# Patient Record
Sex: Female | Born: 1975 | Race: White | Hispanic: No | Smoking: Current every day smoker
Health system: Southern US, Community
[De-identification: ages and names within clinical notes are randomized; demographics above are authoritative.]

## PROBLEM LIST (undated history)

## (undated) DIAGNOSIS — J309 Allergic rhinitis, unspecified: Secondary | ICD-10-CM

## (undated) DIAGNOSIS — Z9889 Other specified postprocedural states: Secondary | ICD-10-CM

## (undated) DIAGNOSIS — Z720 Tobacco use: Secondary | ICD-10-CM

## (undated) DIAGNOSIS — F419 Anxiety disorder, unspecified: Secondary | ICD-10-CM

## (undated) DIAGNOSIS — F329 Major depressive disorder, single episode, unspecified: Secondary | ICD-10-CM

## (undated) HISTORY — PX: TONSILLECTOMY AND ADENOIDECTOMY: SUR1326

## (undated) HISTORY — DX: Tobacco use: Z72.0

## (undated) HISTORY — DX: Other specified postprocedural states: Z98.890

## (undated) HISTORY — DX: Anxiety disorder, unspecified: F41.9

## (undated) HISTORY — DX: Allergic rhinitis, unspecified: J30.9

## (undated) HISTORY — DX: Major depressive disorder, single episode, unspecified: F32.9

---

## 1998-10-30 ENCOUNTER — Emergency Department (HOSPITAL_COMMUNITY): Admission: EM | Admit: 1998-10-30 | Discharge: 1998-10-30 | Payer: Self-pay | Admitting: Emergency Medicine

## 2001-08-15 ENCOUNTER — Emergency Department (HOSPITAL_COMMUNITY): Admission: EM | Admit: 2001-08-15 | Discharge: 2001-08-15 | Payer: Self-pay

## 2001-11-08 ENCOUNTER — Ambulatory Visit (HOSPITAL_COMMUNITY): Admission: RE | Admit: 2001-11-08 | Discharge: 2001-11-08 | Payer: Self-pay

## 2002-02-08 ENCOUNTER — Ambulatory Visit (HOSPITAL_COMMUNITY): Admission: RE | Admit: 2002-02-08 | Discharge: 2002-02-08 | Payer: Self-pay

## 2002-11-22 ENCOUNTER — Encounter (INDEPENDENT_AMBULATORY_CARE_PROVIDER_SITE_OTHER): Payer: Self-pay | Admitting: Specialist

## 2002-11-22 ENCOUNTER — Ambulatory Visit (HOSPITAL_COMMUNITY): Admission: RE | Admit: 2002-11-22 | Discharge: 2002-11-22 | Payer: Self-pay | Admitting: Obstetrics and Gynecology

## 2003-02-24 ENCOUNTER — Other Ambulatory Visit: Admission: RE | Admit: 2003-02-24 | Discharge: 2003-02-24 | Payer: Self-pay | Admitting: Obstetrics and Gynecology

## 2003-08-04 ENCOUNTER — Ambulatory Visit (HOSPITAL_COMMUNITY): Admission: RE | Admit: 2003-08-04 | Discharge: 2003-08-04 | Payer: Self-pay | Admitting: Obstetrics and Gynecology

## 2003-12-05 ENCOUNTER — Inpatient Hospital Stay (HOSPITAL_COMMUNITY): Admission: AD | Admit: 2003-12-05 | Discharge: 2003-12-05 | Payer: Self-pay | Admitting: Obstetrics and Gynecology

## 2004-01-30 ENCOUNTER — Ambulatory Visit: Payer: Self-pay | Admitting: Internal Medicine

## 2004-02-14 ENCOUNTER — Inpatient Hospital Stay (HOSPITAL_COMMUNITY): Admission: AD | Admit: 2004-02-14 | Discharge: 2004-02-14 | Payer: Self-pay | Admitting: Obstetrics and Gynecology

## 2004-03-03 ENCOUNTER — Inpatient Hospital Stay (HOSPITAL_COMMUNITY): Admission: AD | Admit: 2004-03-03 | Discharge: 2004-03-05 | Payer: Self-pay | Admitting: Obstetrics and Gynecology

## 2004-06-19 ENCOUNTER — Ambulatory Visit: Payer: Self-pay | Admitting: Family Medicine

## 2004-06-21 ENCOUNTER — Other Ambulatory Visit: Admission: RE | Admit: 2004-06-21 | Discharge: 2004-06-21 | Payer: Self-pay | Admitting: Obstetrics and Gynecology

## 2004-11-08 ENCOUNTER — Ambulatory Visit: Payer: Self-pay | Admitting: Internal Medicine

## 2004-12-02 ENCOUNTER — Ambulatory Visit: Payer: Self-pay | Admitting: Internal Medicine

## 2006-02-05 ENCOUNTER — Ambulatory Visit: Payer: Self-pay | Admitting: Internal Medicine

## 2006-11-05 ENCOUNTER — Ambulatory Visit: Payer: Self-pay | Admitting: Internal Medicine

## 2006-12-30 ENCOUNTER — Inpatient Hospital Stay (HOSPITAL_COMMUNITY): Admission: AD | Admit: 2006-12-30 | Discharge: 2006-12-30 | Payer: Self-pay | Admitting: Obstetrics

## 2007-03-16 ENCOUNTER — Inpatient Hospital Stay (HOSPITAL_COMMUNITY): Admission: AD | Admit: 2007-03-16 | Discharge: 2007-03-16 | Payer: Self-pay | Admitting: Obstetrics and Gynecology

## 2007-04-13 ENCOUNTER — Inpatient Hospital Stay (HOSPITAL_COMMUNITY): Admission: AD | Admit: 2007-04-13 | Discharge: 2007-04-13 | Payer: Self-pay | Admitting: Obstetrics and Gynecology

## 2007-04-16 ENCOUNTER — Inpatient Hospital Stay (HOSPITAL_COMMUNITY): Admission: AD | Admit: 2007-04-16 | Discharge: 2007-04-17 | Payer: Self-pay | Admitting: Obstetrics

## 2007-06-30 ENCOUNTER — Ambulatory Visit: Admission: RE | Admit: 2007-06-30 | Discharge: 2007-06-30 | Payer: Self-pay | Admitting: Certified Nurse Midwife

## 2009-09-13 ENCOUNTER — Ambulatory Visit: Payer: Self-pay | Admitting: Internal Medicine

## 2009-09-13 DIAGNOSIS — J309 Allergic rhinitis, unspecified: Secondary | ICD-10-CM

## 2009-09-13 DIAGNOSIS — F32A Depression, unspecified: Secondary | ICD-10-CM

## 2009-09-13 HISTORY — DX: Depression, unspecified: F32.A

## 2009-09-13 HISTORY — DX: Allergic rhinitis, unspecified: J30.9

## 2009-12-19 ENCOUNTER — Inpatient Hospital Stay (HOSPITAL_COMMUNITY)
Admission: AD | Admit: 2009-12-19 | Discharge: 2009-12-19 | Payer: Self-pay | Source: Home / Self Care | Attending: Obstetrics | Admitting: Obstetrics

## 2010-02-07 NOTE — Assessment & Plan Note (Signed)
Summary: LG FEVER, COUGH, CONGESTION // RS   Vital Signs:  [name] profile:   35 year old female Weight:      118 pounds Temp:     98.1 degrees F 110oral BP sitting:   110 / 70  (right arm) Cuff size:   regular  Vitals Entered By: Duard Brady LPN (September 13, 2009 9:05 AM) CC: c/o sinus congestion , non productive cough Is [name] Diabetic? No   CC:  c/o sinus congestion  and non productive cough.  History of Present Illness: Gabriella [name] with a 7-day history of mild sore throat, nonproductive cough, malaise.  There is been no fever.  She is on day 3 of 10 of Biaxin from an urgent  care center.  Denies any chest pain or shortness of breath.  Her daughter has had a similar illness.  She continues to smoke  Preventive Screening-Counseling & Management  Alcohol-Tobacco     Smoking Status: current     Smoking Cessation Counseling: yes  Allergies (verified): No Known Drug Allergies  Past History:  Past Medical History: Panic disorder Allergic rhinitis Anxiety Depression  Past Surgical History: T/A 77 G2P2A0 ( 06 and 09) R knee arthoscopic 07  Family History: Reviewed history from 08/26/2006 and no changes required. Family History Breast cancer 1st degree relative <50 Family History Diabetes 1st degree relative Family History Kidney disease  Social History: Reviewed history from 08/26/2006 and no changes required. Occupation: Massage therapist  Married Current Smoker Alcohol use-yes  Review of Systems       The [name] complains of prolonged cough.  The [name] denies anorexia, fever, weight loss, weight gain, vision loss, decreased hearing, hoarseness, chest pain, syncope, dyspnea on exertion, peripheral edema, headaches, hemoptysis, abdominal pain, melena, hematochezia, severe indigestion/heartburn, hematuria, incontinence, genital sores, muscle weakness, suspicious skin lesions, transient blindness, difficulty walking, depression, unusual weight  change, abnormal bleeding, enlarged lymph nodes, angioedema, and breast masses.    Physical Exam  General:  Well-developed,well-nourished,in no acute distress; alert,appropriate and cooperative throughout examination Head:  Normocephalic and atraumatic without obvious abnormalities. No apparent alopecia or balding. Eyes:  No corneal or conjunctival inflammation noted. EOMI. Perrla. Funduscopic exam benign, without hemorrhages, exudates or papilledema. Vision grossly normal. Ears:  External ear exam shows no significant lesions or deformities.  Otoscopic examination reveals clear canals, tympanic membranes are intact bilaterally without bulging, retraction, inflammation or discharge. Hearing is grossly normal bilaterally. Mouth:  Oral mucosa and oropharynx without lesions or exudates.  Teeth in good repair. Neck:  No deformities, masses, or tenderness noted. Chest Wall:  No deformities, masses, or tenderness noted. Lungs:  Normal respiratory effort, chest expands symmetrically. Lungs are clear to auscultation, no crackles or wheezes.   Impression & Recommendations:  Problem # 1:  UPPER RESPIRATORY INFECTION, VIRAL (ICD-465.9)and anger  Her updated medication list for this problem includes:    Tessalon Perles 100 Mg Caps (Benzonatate) .Marland Kitchen... Three times a day prn    Hydrocodone-homatropine 5-1.5 Mg/64ml Syrp (Hydrocodone-homatropine) .Marland Kitchen... 1 teaspoon every 6 hours as needed for cough  Complete Medication List: 1)  **no Regular Rx Meds  2)  Tessalon Perles 100 Mg Caps (Benzonatate) .... Three times a day prn 3)  Biaxin Xl 500 Mg Xr24h-tab (Clarithromycin) .... 2 once daily for 10days 4)  Hydrocodone-homatropine 5-1.5 Mg/12ml Syrp (Hydrocodone-homatropine) .Marland Kitchen.. 1 teaspoon every 6 hours as needed for cough  [name] Instructions: 1)  Get plenty of rest, drink lots of clear liquids, and use Tylenol or Ibuprofen for  fever and comfort. Return in 7-10 days if you're not better:sooner if you're  feeling worse. 2)  Tobacco is very bad for your health and your loved ones! You Should stop smoking!. Prescriptions: HYDROCODONE-HOMATROPINE 5-1.5 MG/5ML SYRP (HYDROCODONE-HOMATROPINE) 1 teaspoon every 6 hours as needed for cough  #6 oz x 1   Entered and Authorized by:   Gordy Savers  MD   Signed by:   Gordy Savers  MD on 09/13/2009   Method used:   Print then Give to [name]   RxID:   [id number]

## 2010-03-18 LAB — HCG, QUANTITATIVE, PREGNANCY: hCG, Beta Chain, Quant, S: 110788 m[IU]/mL — ABNORMAL HIGH (ref <5)

## 2010-05-24 NOTE — H&P (Signed)
NAME:  Gabriella [name], Gabriella [name]                            ACCOUNT NO.:  [id number]   MEDICAL RECORD NO.:  [id number]                   PATIENT TYPE:  AMB   LOCATION:  SDC                                  FACILITY:  [facility]   PHYSICIAN:  Charles A. Sydnee Cabal, MD            DATE OF BIRTH:  [birth year]   DATE OF ADMISSION:  11/22/2002  DATE OF DISCHARGE:                                HISTORY & PHYSICAL   HISTORY OF PRESENT ILLNESS:  This is a 35 year old, P0-0-0-0, [facility] May 28, 2003, now 30 weeks' estimated gestation age who was noted to have an 8-3/7-  week fetal demise now missed abortion.  Ultrasound was done on November 15, 2002, showing these findings with no fetal heart rate or activity consistent  with a fetal demise.  Gestational sac measured 10-3/7 weeks with crown rump  length 8-3/7 weeks.  She has had some spotting since the day of ultrasound  and now presents with decision to have D&C for termination/conclusion of  this missed abortion.   PAST MEDICAL HISTORY:  Anxiety.   PAST SURGICAL HISTORY:  Tonsillectomy and adenoidectomy.   MEDICATIONS:  Xanax, dose not specified since learning of the fetal demise.   ALLERGIES:  No known drug allergies.   SOCIAL HISTORY:  One pack per day cigarette smoking.  No alcohol or drug  use.  The patient is married.   FAMILY HISTORY:  Unrelated.   REVIEW OF SYMPTOMS:  The patient denies any significant pelvic cramping or  pain.  She does have a slight amount of dark blood, no passage of tissue or  clots.   PHYSICAL EXAMINATION:  GENERAL:  Alert and oriented x3.  VITAL SIGNS:  Blood pressure 110/70, heart rate 64, weight 115 pounds,  respirations 16.  HEENT:  Within normal limits.  NECK:  Supple without thyromegaly or adenopathy.  LUNGS:  Clear bilaterally.  HEART:  Regular rate and rhythm without murmurs, rubs or gallops.  ABDOMEN:  Soft, flat, nontender, no hepatosplenomegaly or masses noted.  BREASTS:  Deferred.  PELVIC:  Normal external  female genitalia.  Vault with a small amount of  dark blood.  Cervix is closed.  No cervical motion tenderness is present.  Uterus is mid plane, eight- to nine-week size.  Adnexa nontender.  No masses  bilaterally.  Ovaries normal size bilaterally.  EXTREMITIES:  No edema.   ASSESSMENT:  An 8-3/7 week missed abortion.   PLAN:  1. Suction dilatation and curettage.  2. NPO passed midnight.  3. CBC.  4. The patient gives informed consent and accepts risks of infection,     bleeding, bowel and bladder damage, uterine perforation risk, retained     products risk, blood products risk including hepatitis and HIV exposure.     She gives her informed consent.  Will have consent signed the morning of     procedure.  All questions were answered.  Alternative forms of treatment     including expectant management and spontaneous miscarriage are discussed.     The patient elects for surgical intervention at this time.  All questions     were answered.  She will remain NPO passed midnight and we will proceed     with surgery in the morning as dictated.                                               Charles A. Sydnee Cabal, MD    CAD/MEDQ  D:  11/21/2002  T:  11/21/2002  Job:  [id number]

## 2010-05-24 NOTE — Op Note (Signed)
   NAME:  Gabriella [name], Gabriella [name]                            ACCOUNT NO.:  [id number]   MEDICAL RECORD NO.:  [id number]                   PATIENT TYPE:  AMB   LOCATION:  SDC                                  [facility]ILITY:  [facility]   PHYSICIAN:  Charles A. Sydnee Cabal, MD            DATE OF BIRTH:  01-Jan-1976   DATE OF PROCEDURE:  11/22/2002  DATE OF DISCHARGE:                                 OPERATIVE REPORT   PREOPERATIVE DIAGNOSIS:  Missed abortion, 8-1/2 weeks.   POSTOPERATIVE DIAGNOSIS:  Missed abortion, 8-1/2 weeks.   PROCEDURES:  1. Suction dilation and evacuation.  2. Paracervical block.   SURGEON:  Charles A. Delcambre, MD   COMPLICATIONS:  None.   ESTIMATED BLOOD LOSS:  50 mL.   ANESTHESIA:  Laryngeal general and paracervical block.   SPECIMENS:  Products of conception to pathology.   OPERATIVE FINDINGS:  Examination under anesthesia:  An 8-9 week uterus,  moderate amount of products of conception noted with procedure.   DESCRIPTION OF PROCEDURE:  The patient was taken to the operating room and  placed in the supine position.  General anesthetic was induced without  difficulty.  She was placed in the universal stirrups, Allen type, and  sterilely prepped and draped.  A single-tooth tenaculum was used on the  cervix.  A paracervical block was placed at 5 and 8 o'clock, a total of  approximately 8 mL divided equally of 0.25% plain Marcaine.  Hegar dilators  were then used to dilate up to allow passage of an 8 mm suction curette.  Suction was placed at 50 mmHg and circumferential curetting was undertaken.  There was no evidence of perforation.  A moderate amount of tissue was  aspirated on the first pass.  The second pass yielded minimal tissue.  The  third pass yielded no further tissue.  A banjo curette was then used to  explore the uterine cavity.  The cavity was found to be with no additional  tissue.  There was no evidence of perforation once again with curetting.  The suction  curette was passed one final time and no further tissue or  significant bleeding was encountered.  The procedure was then terminated.  The tenaculum was removed.  All instruments were removed.  The patient was  awakened and taken to recovery, having tolerated the procedure well.                                               Charles A. Sydnee Cabal, MD    CAD/MEDQ  D:  11/22/2002  T:  11/22/2002  Job:  [id number]

## 2010-05-29 ENCOUNTER — Other Ambulatory Visit (HOSPITAL_COMMUNITY): Payer: Self-pay | Admitting: Certified Nurse Midwife

## 2010-05-29 ENCOUNTER — Encounter: Payer: Self-pay | Admitting: Family Medicine

## 2010-05-29 ENCOUNTER — Ambulatory Visit (INDEPENDENT_AMBULATORY_CARE_PROVIDER_SITE_OTHER): Payer: BC Managed Care – PPO | Admitting: Family Medicine

## 2010-05-29 VITALS — BP 130/78 | Temp 98.3°F | Ht 63.0 in | Wt 142.0 lb

## 2010-05-29 DIAGNOSIS — J329 Chronic sinusitis, unspecified: Secondary | ICD-10-CM

## 2010-05-29 MED ORDER — AMOXICILLIN 875 MG PO TABS: 875.0000 mg | ORAL_TABLET | Freq: Two times a day (BID) | ORAL | Status: AC

## 2010-05-29 NOTE — Patient Instructions (Signed)

## 2010-05-29 NOTE — Progress Notes (Signed)
  Subjective:    Patient ID: GIULLIANA MCROBERTS, female    DOB: 1975/08/10, 35 y.o.   MRN: [id number]  HPI Patient is [redacted] weeks pregnant. Same with one-week history of progressive sinus congestive symptoms. Now some cough. Right maxillary facial pain in bilateral frontal sinus pressure. Intermittent headaches. Increase malaise. No fever. Yellowish nasal discharge. Cough mostly nonproductive. History of frequent sinusitis in the past. Possible allergy triggered. Denies nausea, vomiting, or diarrhea. No history of asthma.   Review of Systems  Constitutional: Negative for fever and chills.  HENT: Positive for congestion, postnasal drip and sinus pressure. Negative for voice change.   Respiratory: Positive for cough. Negative for shortness of breath and wheezing.   Cardiovascular: Negative for chest pain, palpitations and leg swelling.  Neurological: Positive for headaches.       Objective:   Physical Exam  Constitutional: She appears well-developed and well-nourished.  HENT:  Head: Normocephalic and atraumatic.  Right Ear: External ear normal.  Left Ear: External ear normal.  Mouth/Throat: Oropharynx is clear and moist.       Swollen mucosa right naris otherwise normal  Eyes: Conjunctivae are normal.  Neck: Neck supple.  Cardiovascular: Normal rate, regular rhythm and normal heart sounds.   Pulmonary/Chest: Effort normal and breath sounds normal. No respiratory distress. She has no wheezes. She has no rales.  Lymphadenopathy:    She has no cervical adenopathy.          Assessment & Plan:  Acute sinusitis. Amoxicillin 875 mg twice daily for 10 days. Followup as needed

## 2010-06-05 ENCOUNTER — Other Ambulatory Visit (HOSPITAL_COMMUNITY): Payer: Self-pay | Admitting: Certified Nurse Midwife

## 2010-06-05 DIAGNOSIS — IMO0001 Reserved for inherently not codable concepts without codable children: Secondary | ICD-10-CM

## 2010-06-13 ENCOUNTER — Ambulatory Visit (HOSPITAL_COMMUNITY): Payer: BC Managed Care – PPO

## 2010-06-13 ENCOUNTER — Other Ambulatory Visit (HOSPITAL_COMMUNITY): Payer: Self-pay | Admitting: Certified Nurse Midwife

## 2010-06-13 ENCOUNTER — Inpatient Hospital Stay (HOSPITAL_COMMUNITY)
Admission: AD | Admit: 2010-06-13 | Discharge: 2010-07-04 | DRG: 370 | Disposition: A | Discharge status: Home / Self Care | Payer: BC Managed Care – PPO | Source: Ambulatory Visit | Attending: Certified Nurse Midwife | Admitting: Certified Nurse Midwife

## 2010-06-13 ENCOUNTER — Ambulatory Visit (HOSPITAL_COMMUNITY)
Admission: RE | Admit: 2010-06-13 | Discharge: 2010-06-13 | Disposition: A | Discharge status: Home / Self Care | Payer: BC Managed Care – PPO | Source: Ambulatory Visit | Attending: Certified Nurse Midwife | Admitting: Certified Nurse Midwife

## 2010-06-13 DIAGNOSIS — O30009 Twin pregnancy, unspecified number of placenta and unspecified number of amniotic sacs, unspecified trimester: Secondary | ICD-10-CM | POA: Diagnosis present

## 2010-06-13 DIAGNOSIS — IMO0001 Reserved for inherently not codable concepts without codable children: Secondary | ICD-10-CM

## 2010-06-13 DIAGNOSIS — O365990 Maternal care for other known or suspected poor fetal growth, unspecified trimester, not applicable or unspecified: Principal | ICD-10-CM | POA: Diagnosis present

## 2010-06-13 DIAGNOSIS — IMO0002 Reserved for concepts with insufficient information to code with codable children: Secondary | ICD-10-CM | POA: Diagnosis present

## 2010-06-13 DIAGNOSIS — O09529 Supervision of elderly multigravida, unspecified trimester: Secondary | ICD-10-CM

## 2010-06-13 DIAGNOSIS — O30049 Twin pregnancy, dichorionic/diamniotic, unspecified trimester: Secondary | ICD-10-CM

## 2010-06-13 DIAGNOSIS — Z3689 Encounter for other specified antenatal screening: Secondary | ICD-10-CM | POA: Insufficient documentation

## 2010-06-13 DIAGNOSIS — O99330 Smoking (tobacco) complicating pregnancy, unspecified trimester: Secondary | ICD-10-CM | POA: Insufficient documentation

## 2010-06-13 LAB — COMPREHENSIVE METABOLIC PANEL
Albumin: 2.1 g/dL — ABNORMAL LOW (ref 3.5–5.2)
Alkaline Phosphatase: 107 U/L (ref 39–117)
CO2: 19 mEq/L (ref 19–32)
Calcium: 8.2 mg/dL — ABNORMAL LOW (ref 8.4–10.5)
Chloride: 103 mEq/L (ref 96–112)
Creatinine, Ser: 0.59 mg/dL (ref 0.4–1.2)
GFR calc Af Amer: >60 mL/min (ref >60)
Potassium: 3.4 mEq/L — ABNORMAL LOW (ref 3.5–5.1)
Sodium: 133 mEq/L — ABNORMAL LOW (ref 135–145)
Total Bilirubin: 0.3 mg/dL (ref 0.3–1.2)

## 2010-06-13 LAB — CBC
HCT: 35.9 % — ABNORMAL LOW (ref 36.0–46.0)
MCHC: 34.5 g/dL (ref 30.0–36.0)
RBC: 4.07 MIL/uL (ref 3.87–5.11)
RDW: 12.7 % (ref 11.5–15.5)
WBC: 14.2 10*3/uL — ABNORMAL HIGH (ref 4.0–10.5)

## 2010-06-13 LAB — URIC ACID: Uric Acid, Serum: 5.2 mg/dL (ref 2.4–7.0)

## 2010-06-15 LAB — CREATININE, URINE, 24 HOUR
Creatinine, Urine: 66.3 mg/dL
Urine Total Volume-UCRE24: 1800 mL

## 2010-06-15 LAB — STREP B DNA PROBE: Strep Group B Ag: NEGATIVE

## 2010-06-17 ENCOUNTER — Inpatient Hospital Stay (HOSPITAL_COMMUNITY): Payer: BC Managed Care – PPO

## 2010-06-17 ENCOUNTER — Other Ambulatory Visit (HOSPITAL_COMMUNITY): Payer: BC Managed Care – PPO

## 2010-06-17 ENCOUNTER — Encounter (HOSPITAL_COMMUNITY): Payer: Self-pay | Admitting: Radiology

## 2010-06-17 LAB — COMPREHENSIVE METABOLIC PANEL
ALT: 10 U/L (ref 0–35)
Alkaline Phosphatase: 87 U/L (ref 39–117)
CO2: 22 mEq/L (ref 19–32)
Chloride: 105 mEq/L (ref 96–112)
GFR calc Af Amer: >60 mL/min (ref >60)
GFR calc non Af Amer: >60 mL/min (ref >60)
Glucose, Bld: 74 mg/dL (ref 70–99)
Potassium: 3.3 mEq/L — ABNORMAL LOW (ref 3.5–5.1)
Sodium: 137 mEq/L (ref 135–145)
Total Protein: 4.2 g/dL — ABNORMAL LOW (ref 6.0–8.3)

## 2010-06-17 LAB — CBC
Hemoglobin: 10.3 g/dL — ABNORMAL LOW (ref 12.0–15.0)
RBC: 3.35 MIL/uL — ABNORMAL LOW (ref 3.87–5.11)

## 2010-06-18 LAB — CBC
HCT: 31.5 % — ABNORMAL LOW (ref 36.0–46.0)
HCT: 31.2 % — ABNORMAL LOW (ref 36.0–46.0)
Hemoglobin: 10.7 g/dL — ABNORMAL LOW (ref 12.0–15.0)
MCH: 30.7 pg (ref 26.0–34.0)
MCH: 30.7 pg (ref 26.0–34.0)
MCHC: 34.3 g/dL (ref 30.0–36.0)
MCV: 89 fL (ref 78.0–100.0)
Platelets: 156 10*3/uL (ref 150–400)
Platelets: 178 10*3/uL (ref 150–400)
RBC: 3.48 MIL/uL — ABNORMAL LOW (ref 3.87–5.11)
RBC: 3.55 MIL/uL — ABNORMAL LOW (ref 3.87–5.11)
RDW: 13.3 % (ref 11.5–15.5)
WBC: 14.2 10*3/uL — ABNORMAL HIGH (ref 4.0–10.5)

## 2010-06-18 LAB — COMPREHENSIVE METABOLIC PANEL
ALT: 28 U/L (ref 0–35)
AST: 56 U/L — ABNORMAL HIGH (ref 0–37)
AST: 58 U/L — ABNORMAL HIGH (ref 0–37)
Albumin: 1.8 g/dL — ABNORMAL LOW (ref 3.5–5.2)
Alkaline Phosphatase: 93 U/L (ref 39–117)
Alkaline Phosphatase: 96 U/L (ref 39–117)
BUN: 9 mg/dL (ref 6–23)
CO2: 21 mEq/L (ref 19–32)
CO2: 24 mEq/L (ref 19–32)
Calcium: 7.9 mg/dL — ABNORMAL LOW (ref 8.4–10.5)
Calcium: 9.3 mg/dL (ref 8.4–10.5)
Chloride: 103 mEq/L (ref 96–112)
Creatinine, Ser: 0.72 mg/dL (ref 0.4–1.2)
GFR calc Af Amer: >60 mL/min (ref >60)
GFR calc Af Amer: >60 mL/min (ref >60)
GFR calc non Af Amer: >60 mL/min (ref >60)
GFR calc non Af Amer: >60 mL/min (ref >60)
Glucose, Bld: 94 mg/dL (ref 70–99)
Potassium: 3.4 mEq/L — ABNORMAL LOW (ref 3.5–5.1)
Potassium: 3.1 mEq/L — ABNORMAL LOW (ref 3.5–5.1)
Sodium: 136 mEq/L (ref 135–145)
Total Bilirubin: 0.4 mg/dL (ref 0.3–1.2)
Total Protein: 4.6 g/dL — ABNORMAL LOW (ref 6.0–8.3)

## 2010-06-18 LAB — LACTATE DEHYDROGENASE
LDH: 226 U/L (ref 94–250)
LDH: 266 U/L — ABNORMAL HIGH (ref 94–250)

## 2010-06-18 LAB — URIC ACID: Uric Acid, Serum: 6.5 mg/dL (ref 2.4–7.0)

## 2010-06-19 ENCOUNTER — Ambulatory Visit (HOSPITAL_COMMUNITY): Payer: BC Managed Care – PPO

## 2010-06-19 LAB — CBC
MCH: 30.5 pg (ref 26.0–34.0)
MCV: 90.2 fL (ref 78.0–100.0)
Platelets: 151 10*3/uL (ref 150–400)
RBC: 3.77 MIL/uL — ABNORMAL LOW (ref 3.87–5.11)
RDW: 13.5 % (ref 11.5–15.5)

## 2010-06-19 LAB — URIC ACID: Uric Acid, Serum: 6.4 mg/dL (ref 2.4–7.0)

## 2010-06-20 ENCOUNTER — Other Ambulatory Visit (HOSPITAL_COMMUNITY): Payer: BC Managed Care – PPO

## 2010-06-20 LAB — COMPREHENSIVE METABOLIC PANEL
ALT: 21 U/L (ref 0–35)
AST: 32 U/L (ref 0–37)
Albumin: 1.7 g/dL — ABNORMAL LOW (ref 3.5–5.2)
CO2: 22 mEq/L (ref 19–32)
Calcium: 8.3 mg/dL — ABNORMAL LOW (ref 8.4–10.5)
Creatinine, Ser: 0.63 mg/dL (ref 0.4–1.2)
GFR calc non Af Amer: >60 mL/min (ref >60)
Sodium: 137 mEq/L (ref 135–145)
Total Protein: 4.4 g/dL — ABNORMAL LOW (ref 6.0–8.3)

## 2010-06-21 ENCOUNTER — Inpatient Hospital Stay (HOSPITAL_COMMUNITY): Payer: BC Managed Care – PPO

## 2010-06-21 ENCOUNTER — Other Ambulatory Visit (HOSPITAL_COMMUNITY): Payer: BC Managed Care – PPO

## 2010-06-21 LAB — COMPREHENSIVE METABOLIC PANEL
ALT: 15 U/L (ref 0–35)
Alkaline Phosphatase: 102 U/L (ref 39–117)
CO2: 21 mEq/L (ref 19–32)
GFR calc Af Amer: >60 mL/min (ref >60)
GFR calc non Af Amer: >60 mL/min (ref >60)
Glucose, Bld: 69 mg/dL — ABNORMAL LOW (ref 70–99)
Potassium: 3.6 mEq/L (ref 3.5–5.1)
Sodium: 136 mEq/L (ref 135–145)
Total Bilirubin: 0.3 mg/dL (ref 0.3–1.2)

## 2010-06-21 LAB — URIC ACID: Uric Acid, Serum: 5.5 mg/dL (ref 2.4–7.0)

## 2010-06-21 LAB — CBC
HCT: 33 % — ABNORMAL LOW (ref 36.0–46.0)
Hemoglobin: 11.1 g/dL — ABNORMAL LOW (ref 12.0–15.0)
RBC: 3.63 MIL/uL — ABNORMAL LOW (ref 3.87–5.11)

## 2010-06-22 ENCOUNTER — Inpatient Hospital Stay (HOSPITAL_COMMUNITY): Payer: BC Managed Care – PPO

## 2010-06-23 ENCOUNTER — Inpatient Hospital Stay (HOSPITAL_COMMUNITY): Payer: BC Managed Care – PPO

## 2010-06-23 LAB — COMPREHENSIVE METABOLIC PANEL
Alkaline Phosphatase: 102 U/L (ref 39–117)
BUN: 10 mg/dL (ref 6–23)
CO2: 21 mEq/L (ref 19–32)
Calcium: 8.9 mg/dL (ref 8.4–10.5)
GFR calc Af Amer: >60 mL/min (ref >60)
GFR calc non Af Amer: >60 mL/min (ref >60)
Glucose, Bld: 87 mg/dL (ref 70–99)
Potassium: 3.7 mEq/L (ref 3.5–5.1)
Total Protein: 5 g/dL — ABNORMAL LOW (ref 6.0–8.3)

## 2010-06-23 LAB — CBC
HCT: 32.4 % — ABNORMAL LOW (ref 36.0–46.0)
Hemoglobin: 10.9 g/dL — ABNORMAL LOW (ref 12.0–15.0)
MCH: 31.1 pg (ref 26.0–34.0)
MCHC: 33.6 g/dL (ref 30.0–36.0)

## 2010-06-23 LAB — URIC ACID: Uric Acid, Serum: 5.7 mg/dL (ref 2.4–7.0)

## 2010-06-24 ENCOUNTER — Ambulatory Visit (HOSPITAL_COMMUNITY): Payer: BC Managed Care – PPO

## 2010-06-26 ENCOUNTER — Inpatient Hospital Stay (HOSPITAL_COMMUNITY): Payer: BC Managed Care – PPO | Attending: Obstetrics and Gynecology

## 2010-06-26 ENCOUNTER — Inpatient Hospital Stay (HOSPITAL_COMMUNITY)
Admit: 2010-06-26 | Discharge: 2010-06-26 | Disposition: A | Discharge status: Home / Self Care | Payer: BC Managed Care – PPO | Attending: Obstetrics and Gynecology | Admitting: Obstetrics and Gynecology

## 2010-06-26 ENCOUNTER — Inpatient Hospital Stay (HOSPITAL_COMMUNITY): Payer: BC Managed Care – PPO

## 2010-06-26 ENCOUNTER — Ambulatory Visit (HOSPITAL_COMMUNITY): Payer: BC Managed Care – PPO

## 2010-06-26 LAB — COMPREHENSIVE METABOLIC PANEL
ALT: 10 U/L (ref 0–35)
AST: 16 U/L (ref 0–37)
Albumin: 1.8 g/dL — ABNORMAL LOW (ref 3.5–5.2)
Alkaline Phosphatase: 111 U/L (ref 39–117)
Calcium: 9.3 mg/dL (ref 8.4–10.5)
GFR calc Af Amer: >60 mL/min (ref >60)
Glucose, Bld: 73 mg/dL (ref 70–99)
Potassium: 3.9 mEq/L (ref 3.5–5.1)
Sodium: 135 mEq/L (ref 135–145)
Total Protein: 4.7 g/dL — ABNORMAL LOW (ref 6.0–8.3)

## 2010-06-26 LAB — CBC
Hemoglobin: 11.3 g/dL — ABNORMAL LOW (ref 12.0–15.0)
MCHC: 32.9 g/dL (ref 30.0–36.0)
Platelets: 227 10*3/uL (ref 150–400)

## 2010-06-28 ENCOUNTER — Inpatient Hospital Stay (HOSPITAL_COMMUNITY): Payer: BC Managed Care – PPO

## 2010-06-28 ENCOUNTER — Ambulatory Visit (HOSPITAL_COMMUNITY): Payer: BC Managed Care – PPO

## 2010-06-30 LAB — CBC
HCT: 35.8 % — ABNORMAL LOW (ref 36.0–46.0)
Hemoglobin: 11.8 g/dL — ABNORMAL LOW (ref 12.0–15.0)
MCH: 30.5 pg (ref 26.0–34.0)
MCV: 92.5 fL (ref 78.0–100.0)
RBC: 3.87 MIL/uL (ref 3.87–5.11)
WBC: 11.1 10*3/uL — ABNORMAL HIGH (ref 4.0–10.5)

## 2010-06-30 LAB — COMPREHENSIVE METABOLIC PANEL
AST: 15 U/L (ref 0–37)
BUN: 10 mg/dL (ref 6–23)
CO2: 20 mEq/L (ref 19–32)
Calcium: 9.1 mg/dL (ref 8.4–10.5)
Chloride: 106 mEq/L (ref 96–112)
Creatinine, Ser: 0.64 mg/dL (ref 0.50–1.10)
GFR calc Af Amer: >60 mL/min (ref >60)
GFR calc non Af Amer: >60 mL/min (ref >60)
Glucose, Bld: 77 mg/dL (ref 70–99)
Total Bilirubin: 0.2 mg/dL — ABNORMAL LOW (ref 0.3–1.2)

## 2010-06-30 LAB — URIC ACID: Uric Acid, Serum: 5.8 mg/dL (ref 2.4–7.0)

## 2010-07-01 ENCOUNTER — Inpatient Hospital Stay (HOSPITAL_COMMUNITY): Payer: BC Managed Care – PPO

## 2010-07-01 ENCOUNTER — Other Ambulatory Visit: Payer: Self-pay | Admitting: Obstetrics and Gynecology

## 2010-07-01 ENCOUNTER — Ambulatory Visit (HOSPITAL_COMMUNITY): Payer: BC Managed Care – PPO

## 2010-07-01 LAB — CBC
HCT: 41.8 % (ref 36.0–46.0)
MCHC: 33.3 g/dL (ref 30.0–36.0)
Platelets: 263 10*3/uL (ref 150–400)
RDW: 14.7 % (ref 11.5–15.5)
WBC: 10.4 10*3/uL (ref 4.0–10.5)

## 2010-07-02 ENCOUNTER — Other Ambulatory Visit (HOSPITAL_COMMUNITY): Payer: BC Managed Care – PPO

## 2010-07-02 LAB — CBC
MCH: 30.7 pg (ref 26.0–34.0)
MCV: 92.7 fL (ref 78.0–100.0)
Platelets: 215 10*3/uL (ref 150–400)
RBC: 3.42 MIL/uL — ABNORMAL LOW (ref 3.87–5.11)
RDW: 14.5 % (ref 11.5–15.5)
WBC: 12.9 10*3/uL — ABNORMAL HIGH (ref 4.0–10.5)

## 2010-07-04 LAB — LACTATE DEHYDROGENASE: LDH: 209 U/L (ref 94–250)

## 2010-07-04 LAB — COMPREHENSIVE METABOLIC PANEL
Calcium: 9.8 mg/dL (ref 8.4–10.5)
Chloride: 104 mEq/L (ref 96–112)
Creatinine, Ser: 0.58 mg/dL (ref 0.50–1.10)
GFR calc Af Amer: >60 mL/min (ref >60)
GFR calc non Af Amer: >60 mL/min (ref >60)
Potassium: 3.8 mEq/L (ref 3.5–5.1)
Sodium: 137 mEq/L (ref 135–145)

## 2010-07-04 LAB — CBC
MCHC: 33.1 g/dL (ref 30.0–36.0)
RDW: 14.5 % (ref 11.5–15.5)
WBC: 14.3 10*3/uL — ABNORMAL HIGH (ref 4.0–10.5)

## 2010-07-04 LAB — URIC ACID: Uric Acid, Serum: 5.6 mg/dL (ref 2.4–7.0)

## 2010-07-12 NOTE — H&P (Signed)
  NAMEAARIAN, Gabriella [name] NO.:  [id number]  MEDICAL RECORD NO.:  [id number]  LOCATION:  MFM                           FACILITY:  [facility]  PHYSICIAN:  Lenoard Aden, M.D.DATE OF BIRTH:  July 21, 1975  DATE OF ADMISSION:  06/13/2010 DATE OF DISCHARGE:                             HISTORY & PHYSICAL   CHIEF COMPLAINT:  Intrauterine growth restriction, abnormal Dopplers.  HISTORY OF PRESENT ILLNESS:  A 35 year old white female G20, P2 at 88- 1/7th week's gestation with dichorionic diamniotic twin gestation, new onset IUGR noted and non-presenting twin with an estimated fetal weight of less than 10th percentile and umbilical artery Dopplers with absent end-diastolic flow.  Admission monitoring today after discussion with Maternal Fetal Medicine.  She has no known drug allergies.  Medications are prenatal vitamins.  She is a current smoker, approximately half pack a day.  History of previous 2 uncomplicated vaginal deliveries and 1 missed AB with D and C in 2004.  PHYSICAL EXAMINATION:  VITAL SIGNS:  Blood pressure 137/88. HEENT:  Normal. NECK:  Supple.  Full range of motion. LUNGS:  Clear. HEART:  Regular rate and rhythm. ABDOMEN:  Soft, gravid, nontender.  No right upper quadrant tenderness. No CVA tenderness. EXTREMITIES:  There are no cords. NEUROLOGIC:  Nonfocal. SKIN:  Intact.  NST is reassuring for A and B with irregular contractions noted and ultrasound with MFM today showed a BPP of 8  x2.  CMP, CBC, uric acid, LDH are all normal today.  IMPRESSION: 1. A 31-1/7th week twin dichorionic diamniotic gestation. 2. New-onset intrauterine growth restriction in non-presenting twin     with absent end-diastolic flow on umbilical artery Dopplers. 3. Small for gestational age with normal Dopplers in presenting twin. 4. Vertex breech presentation. 5. Smoker. 6. Questionable gestational hypertension.  No signs of preeclampsia.  PLAN:  Will be admit, continuous  monitoring, betamethasone, NICU consult, BPP, and Dopplers twice weekly as recommended by Dr. __________.  Watch blood pressures closely, PIH precautions.     Lenoard Aden, M.D.     RJT/MEDQ  D:  06/13/2010  T:  06/13/2010  Job:  [id number]  Electronically Signed by Olivia Mackie M.D. on 07/12/2010 07:32:26 AM

## 2010-07-12 NOTE — Op Note (Signed)
  Gabriella [name], Gabriella [name]                  ACCOUNT NO.:  [id number]  MEDICAL RECORD NO.:  [id number]  LOCATION:  310                            FACILITY:  PHYSICIAN:  Lenoard Aden, M.D.DATE OF BIRTH:  1975/08/27  DATE OF PROCEDURE: DATE OF DISCHARGE:                              OPERATIVE REPORT   PREOPERATIVE DIAGNOSES: 1. 33-3/7 week intrauterine pregnancy 2. Severe intrauterine growth restriction of twin B with a reverse     abnormal for reversal of the umbilical artery Doppler flow. 3. Mild preeclampsia.  POSTOPERATIVE DIAGNOSES: 1. 33-3/7 week intrauterine pregnancy 2. Severe intrauterine growth restriction of twin B with a reverse     abnormal for reversal of the umbilical artery Doppler flow. 3. Mild preeclampsia.  PROCEDURE:  Primary low segment transverse cesarean section.  SURGEON:  Lenoard Aden, MD  ASSISTANT:  Fredric Mare  ANESTHESIA:  Spinal by Malen Gauze, local by Billy Coast.  ESTIMATED BLOOD LOSS:  1000 mL.  COMPLICATIONS:  None.  DRAINS:  Foley.  COUNTS:  Correct.  The patient to recovery in good condition.  BRIEF OPERATIVE NOTE:  After being apprised of risks of anesthesia, infection, bleeding, injury to abdominal organs, need for repair delayed versus immediate complications including bowel and bladder injury, possible need for repair, the patient was brought to the operating where she was administered a spinal anesthetic without complications, prepped and draped in usual sterile fashion.  Foley catheter placed after achieving adequate anesthesia dilute Marcaine solution placed.  A Pfannenstiel skin incision was made with scalpel, carried down to fascia which nicked in the midline and then transversely using Mayo scissors. Rectus muscles was dissected sharply in midline peritoneum entered sharply.  Bladder blade placed.  Visceral peritoneum left intact and lower uterine segment incision made.  Atraumatic delivery of clear amniotic fluid, vertex  presenting A handed to pediatricians in attendance.  Apgars were 4 and 6 was noted.  Amniotomy clear fluid B, handed pediatricians in attendance.  Apgars of 8 and 9.  Placenta was delivered manually intact, three-vessel cord visualized.  Placenta sent to Pathology.  Uterus to exteriorized, curetted using dry lap pack and closed in two running imbricating layers of 0 Monocryl suture.  There was interrupted suture at the right lateral margin for hemostasis. Bladder was inspected and found to be without complication.  Urine output was adequate.  Urine output was clear.  At this time, the parietal peritoneum was closed using a 2-0 Vicryl in a continuous running fashion.  The fascia was closed using 0 Monocryl in a continuous running fashion.  Skin closed using staples.  The patient tolerated procedure well and was transferred to recovery in good condition.     Lenoard Aden, M.D.     RJT/MEDQ  D:  07/01/2010  T:  07/02/2010  Job:  [id number]  Electronically Signed by Olivia Mackie M.D. on 07/12/2010 07:32:28 AM

## 2010-07-25 NOTE — Discharge Summary (Signed)
Gabriella [name], [name]                  ACCOUNT NO.:  [id number]  MEDICAL RECORD NO.:  [id number]  LOCATION:  9310                          FACILITY:  [facility]  PHYSICIAN:  Lenoard Aden, M.D.DATE OF BIRTH:  [birth year]  DATE OF ADMISSION:  06/13/2010 DATE OF DISCHARGE:  07/04/2010                              DISCHARGE SUMMARY   ADMITTING DIAGNOSES:  Twin gestation dichorionic-diamniotic (twins), discordant growth, twin A with 2 vessel cord, twin B with intrauterine growth restriction, maternal substance abuse with tobacco.  DISCHARGE DIAGNOSIS:  Postoperative day #3 status post cesarean section, preterm twins, preeclampsia- resolving, maternal substance abuse - tobacco.  HISTORY OF PRESENT ILLNESS:  The patient is a 34-year gravida 4, para 2- 0-1-2 at 31 weeks and 1-day. Twin A presenting with a two-vessel cord, twin B presenting with a three-vessel cord and intrauterine growth restriction.   PRENATAL COURSE: Prenatal course has been [facility] with Gabriella [name], CNM as primary. Pregnancy is complicated by twin gestation,  Twin A with two-vessel cord, advanced maternal age, tobacco use and discordant twin growth.  PRENATAL LABS:  The patient is O+, antibody screen negative, rubella positive, HIV negative, hepatitis B negative, RPR negative, GTT screening was within normal limits. Fetal cardiac echo due to presence 2vc on one twin fetus - results normal cardiac assessment.  The patient is admitted after ultrasound for growth and identification of twin B with growth restriction at less than 10th percentile and abnormal  fetal Dopplers.  Admission CBC white blood cell count 14.2, hemoglobin 12.4, hematocrit 25.9, and platelet count of 267.  HOSPITAL COURSE:  The patient was admitted for inpatient antepartum Management and fetal surveillance of twins with discordant growth and IUGR twin B. The patient was initiated on a course of betamethasone.  The patient received maternal fetal medicine  consult and NICU consult.  Additional lab screening for preeclampsia with PIH labs and 24-hour urine.  Patient is offered medication for smoking cessation to which she declined on several occasions. She continued to smoke daily during her hospitalization Despite medical advice for cessation. On hospital day #6 there was increase  In maternalblood pressure pregnancy, PIH labs were repeated with results Showing decrease in platelet count and increasing liver enzymes. With a concern for development of HELLP syndrome, fetal surveillance was increased.  Fetal surveillance with NST TID and Doppler assessment 3 times a week. On June 21, 2010, the patient was initiated on labetalol therapy 100 mg p.o. b.i.d. for gestationalhypertensive disorder with increase to 200 mg BID by time of delivery. PIH labs remain stable without evidence of HELLP syndrome.  On June 30, 2010 with reverse end diastolic flow dopplers on twin B. Decision is to proceed for cesarean section per MFM recomemendation.  PROCEDURE:  See operative report for details.  Cesarean section by Dr. Billy Coast - both twins transported to NICU.  POSTOPERATIVE COURSE:  The patient's postoperative course was uneventful.  Physical exam is within normal limits.  Vital signs were stable.  Blood pressure range of 120-145/80-96 on labetalol 200 mg BID. Liver enzymes remained within normal limits without evidence of HELLP syndrome.  Wound edges were well approximated with staples.  Incision intact without erythema,  ecchymosis or drainage.  The patient is discharged home in stable condition on postoperative day #3.  DISCHARGE INSTRUCTIONS:  Discharge instructions per [facility] postpartum booklet.  Diet is regular.  Activity per postoperative restrictions x2 weeks.  MEDICATIONS AT TIME OF DISCHARGE:  Include a 1. Prenatal vitamin 1 tablet p.o. daily. 2. Ibuprofen 800 mg every 8 hours as needed for discomfort. 3. Percocet 1-2 tablets every 4 hours as needed for  pain. 4. Labetalol 200 mg p.o. every 12 hours.  The patient to return to [facility] on July 07, 2010 for blood pressure check and staple removal and in 6 weeks for routine postpartum.     Gabriella [name], C.N.M.   ______________________________ Lenoard Aden, M.D.    TB/MEDQ  D:  07/24/2010  T:  07/25/2010  Job:  [id number]

## 2010-09-30 LAB — URINALYSIS, ROUTINE W REFLEX MICROSCOPIC
Bilirubin Urine: NEGATIVE
Ketones, ur: NEGATIVE
Specific Gravity, Urine: <1.005 — ABNORMAL LOW
pH: 6

## 2010-09-30 LAB — URINE MICROSCOPIC-ADD ON: WBC, UA: NONE SEEN

## 2010-10-01 LAB — CBC
HCT: 35.6 — ABNORMAL LOW
Hemoglobin: 12.6
RBC: 3.87

## 2010-10-11 LAB — URINALYSIS, ROUTINE W REFLEX MICROSCOPIC
Glucose, UA: NEGATIVE
Leukocytes, UA: NEGATIVE
Specific Gravity, Urine: <1.005 — ABNORMAL LOW
pH: 6.5

## 2010-10-11 LAB — URINE MICROSCOPIC-ADD ON

## 2010-10-11 LAB — URINE CULTURE

## 2011-12-10 ENCOUNTER — Encounter: Payer: Self-pay | Admitting: Internal Medicine

## 2011-12-10 ENCOUNTER — Ambulatory Visit (INDEPENDENT_AMBULATORY_CARE_PROVIDER_SITE_OTHER): Payer: BC Managed Care – PPO | Admitting: Internal Medicine

## 2011-12-10 VITALS — BP 100/60 | HR 88 | Temp 97.7°F | Resp 18 | Wt 110.0 lb

## 2011-12-10 DIAGNOSIS — F3289 Other specified depressive episodes: Secondary | ICD-10-CM

## 2011-12-10 DIAGNOSIS — F329 Major depressive disorder, single episode, unspecified: Secondary | ICD-10-CM

## 2011-12-10 DIAGNOSIS — F411 Generalized anxiety disorder: Secondary | ICD-10-CM

## 2011-12-10 MED ORDER — ESCITALOPRAM OXALATE 10 MG PO TABS: 10.0000 mg | ORAL_TABLET | Freq: Every day | ORAL | Status: DC

## 2011-12-10 MED ORDER — LORAZEPAM 0.5 MG PO TABS: 0.5000 mg | ORAL_TABLET | Freq: Two times a day (BID) | ORAL | Status: DC | PRN

## 2011-12-10 NOTE — Patient Instructions (Signed)
It is important that you exercise regularly, at least 20 minutes 3 to 4 times per week.  If you develop chest pain or shortness of breath seek  medical attention.   

## 2011-12-10 NOTE — Progress Notes (Signed)
  Subjective:    Patient ID: Gabriella [name], female    DOB: [birth year], 36 y.o.   MRN: [id number]  HPI  36 year old patient who has a history of depression who presents today with a chief complaint of anxiety. She has children including 54  82-month-old twins.  She feels quite anxious and overextended. She's had difficulty sleeping at night due 2 excess of worry and intrusive thoughts. She has been on BuSpar and Wellbutrin from April through October of this year without much benefit. She states she has been on Paxil and Zoloft in the past for depression but there were some side effect issues including  loss of libido.  Past Medical History  Diagnosis Date  . ANXIETY 09/13/2009  . DEPRESSION 09/13/2009  . ALLERGIC RHINITIS 09/13/2009  . S/P right knee arthroscopy     History   Social History  . Marital Status: Married    Spouse Name: N/A    Number of Children: N/A  . Years of Education: N/A   Occupational History  . Not on file.   Social History Main Topics  . Smoking status: Current Every Day Smoker -- 1.0 packs/day for 20 years    Types: Cigarettes  . Smokeless tobacco: Not on file  . Alcohol Use: Not on file  . Drug Use: Not on file  . Sexually Active: Not on file   Other Topics Concern  . Not on file   Social History Narrative  . No narrative on file    Past Surgical History  Procedure Date  . Tonsillectomy and adenoidectomy     Family History  Problem Relation Age of Onset  . Cancer Neg Hx     family hx breast ca   . Kidney disease Neg Hx     family hx  . Diabetes Neg Hx     family hx    No Known Allergies  Current Outpatient Prescriptions on File Prior to Visit  Medication Sig Dispense Refill  . Prenatal Vit-Fe Psac Cmplx-FA (PRENATAL MULTIVITAMIN) 60-1 MG tablet Take 1 tablet by mouth daily with breakfast.          BP 100/60  Pulse 88  Temp 97.7 F (36.5 C) (Oral)  Resp 18  Wt 110 lb (49.896 kg)  Breastfeeding? Unknown       Review of Systems   Psychiatric/Behavioral: Positive for behavioral problems. The patient is nervous/anxious.        Objective:   Physical Exam  Constitutional: She appears well-developed and well-nourished. No distress.  Psychiatric: She has a normal mood and affect. Her behavior is normal. Judgment and thought content normal.          Assessment & Plan:   Anxiety disorder. Options were discussed;  we'll try Lexapro and when necessary lorazepam. We'll recheck in 6 weeks and consider behavioral health referral at that time if response is suboptimal

## 2012-01-21 ENCOUNTER — Encounter: Payer: Self-pay | Admitting: Internal Medicine

## 2012-01-21 ENCOUNTER — Ambulatory Visit (INDEPENDENT_AMBULATORY_CARE_PROVIDER_SITE_OTHER): Payer: BC Managed Care – PPO | Admitting: Internal Medicine

## 2012-01-21 VITALS — BP 100/68 | HR 76 | Temp 98.6°F | Resp 16 | Wt 108.0 lb

## 2012-01-21 DIAGNOSIS — F411 Generalized anxiety disorder: Secondary | ICD-10-CM

## 2012-01-21 DIAGNOSIS — F329 Major depressive disorder, single episode, unspecified: Secondary | ICD-10-CM

## 2012-01-21 DIAGNOSIS — J069 Acute upper respiratory infection, unspecified: Secondary | ICD-10-CM

## 2012-01-21 DIAGNOSIS — F17200 Nicotine dependence, unspecified, uncomplicated: Secondary | ICD-10-CM

## 2012-01-21 DIAGNOSIS — Z720 Tobacco use: Secondary | ICD-10-CM | POA: Insufficient documentation

## 2012-01-21 MED ORDER — HYDROCODONE-HOMATROPINE 5-1.5 MG/5ML PO SYRP: 5.0000 mL | ORAL_SOLUTION | Freq: Four times a day (QID) | ORAL | Status: AC | PRN

## 2012-01-21 MED ORDER — LORAZEPAM 0.5 MG PO TABS: 0.5000 mg | ORAL_TABLET | Freq: Two times a day (BID) | ORAL | Status: DC | PRN

## 2012-01-21 MED ORDER — ESCITALOPRAM OXALATE 10 MG PO TABS: 10.0000 mg | ORAL_TABLET | Freq: Every day | ORAL | Status: DC

## 2012-01-21 NOTE — Progress Notes (Signed)
  Subjective:    Patient ID: Gabriella [name], female    DOB: [birth year], 37 y.o.   MRN: [id number]  HPI 37 year old patient who is seen today for a six-week followup. She was placed on Lexapro 6 weeks ago and has had a very nice clinical response she is much less anxious sleeping better and feels much improved areas initially she was using lorazepam once daily but now it takes this very infrequently. She is very pleased with her progress as tolerated this medication well ; her only complaint is a one-week history of sinus congestion and drainage this also seems to be improving    Review of Systems  Constitutional: Negative.   HENT: Positive for congestion, rhinorrhea and postnasal drip. Negative for hearing loss, sore throat, dental problem, sinus pressure and tinnitus.   Eyes: Negative for pain, discharge and visual disturbance.  Respiratory: Negative for cough and shortness of breath.   Cardiovascular: Negative for chest pain, palpitations and leg swelling.  Gastrointestinal: Negative for nausea, vomiting, abdominal pain, diarrhea, constipation, blood in stool and abdominal distention.  Genitourinary: Negative for dysuria, urgency, frequency, hematuria, flank pain, vaginal bleeding, vaginal discharge, difficulty urinating, vaginal pain and pelvic pain.  Musculoskeletal: Negative for joint swelling, arthralgias and gait problem.  Skin: Negative for rash.  Neurological: Negative for dizziness, syncope, speech difficulty, weakness, numbness and headaches.  Hematological: Negative for adenopathy.  Psychiatric/Behavioral: Negative for behavioral problems, dysphoric mood and agitation. The patient is not nervous/anxious.        Objective:   Physical Exam  Constitutional: She is oriented to person, place, and time. She appears well-developed and well-nourished.  HENT:  Head: Normocephalic.  Right Ear: External ear normal.  Left Ear: External ear normal.  Mouth/Throat: Oropharynx is clear and  moist.       Very  mild left maxillary sinus tenderness  Eyes: Conjunctivae normal and EOM are normal. Pupils are equal, round, and reactive to light.  Neck: Normal range of motion. Neck supple. No thyromegaly present.  Cardiovascular: Normal rate, regular rhythm, normal heart sounds and intact distal pulses.   Pulmonary/Chest: Effort normal and breath sounds normal.  Abdominal: Soft. Bowel sounds are normal. She exhibits no mass. There is no tenderness.  Musculoskeletal: Normal range of motion.  Lymphadenopathy:    She has no cervical adenopathy.  Neurological: She is alert and oriented to person, place, and time.  Skin: Skin is warm and dry. No rash noted.  Psychiatric: She has a normal mood and affect. Her behavior is normal.          Assessment & Plan:  Anxiety depression. Much improved Viral URI improving. Cough is quite bothersome and he fell asleep we'll treat symptomatically Tobacco use. Total smoking cessation encouraged  Recheck 6 months or as needed

## 2012-01-21 NOTE — Patient Instructions (Addendum)
Acute sinusitis symptoms for less than 10 days are generally not helped by antibiotic therapy.  Use saline irrigation, warm  moist compresses and over-the-counter decongestants only as directed.  Call if there is no improvement in 5 to 7 days, or sooner if you develop increasing pain, fever, or any new symptoms.   Smoking tobacco is very bad for your health. You should stop smoking immediately. Return in 6 months for follow-up

## 2012-04-07 ENCOUNTER — Other Ambulatory Visit: Payer: Self-pay | Admitting: Internal Medicine

## 2012-06-24 ENCOUNTER — Encounter: Payer: Self-pay | Admitting: Internal Medicine

## 2012-06-24 ENCOUNTER — Ambulatory Visit (INDEPENDENT_AMBULATORY_CARE_PROVIDER_SITE_OTHER): Payer: BC Managed Care – PPO | Admitting: Internal Medicine

## 2012-06-24 VITALS — BP 102/70 | HR 84 | Temp 98.2°F | Resp 20 | Wt 107.0 lb

## 2012-06-24 DIAGNOSIS — F17200 Nicotine dependence, unspecified, uncomplicated: Secondary | ICD-10-CM

## 2012-06-24 DIAGNOSIS — R071 Chest pain on breathing: Secondary | ICD-10-CM

## 2012-06-24 DIAGNOSIS — M25579 Pain in unspecified ankle and joints of unspecified foot: Secondary | ICD-10-CM

## 2012-06-24 DIAGNOSIS — Z720 Tobacco use: Secondary | ICD-10-CM

## 2012-06-24 NOTE — Progress Notes (Signed)
  Subjective:    Patient ID: Gabriella [name], female    DOB: 1975-12-29, 37 y.o.   MRN: [id number]  HPI  37 year old patient who presents with 2 complaints; she has developed right anterior chest wall pain after doing yard work which involves considerable Bending stooping and pulling up weeds. Her chief complaint is pain involving the left ankle area. For about 2 months she has had brief paroxysms of sharp pain . No obvious trauma or aggravating factors. He  Past Medical History  Diagnosis Date  . ANXIETY 09/13/2009  . DEPRESSION 09/13/2009  . ALLERGIC RHINITIS 09/13/2009  . S/P right knee arthroscopy     History   Social History  . Marital Status: Married    Spouse Name: N/A    Number of Children: N/A  . Years of Education: N/A   Occupational History  . Not on file.   Social History Main Topics  . Smoking status: Current Every Day Smoker -- 1.00 packs/day for 20 years    Types: Cigarettes  . Smokeless tobacco: Not on file  . Alcohol Use: Not on file  . Drug Use: Not on file  . Sexually Active: Not on file   Other Topics Concern  . Not on file   Social History Narrative  . No narrative on file    Past Surgical History  Procedure Laterality Date  . Tonsillectomy and adenoidectomy      Family History  Problem Relation Age of Onset  . Cancer Neg Hx     family hx breast ca   . Kidney disease Neg Hx     family hx  . Diabetes Neg Hx     family hx    No Known Allergies  Current Outpatient Prescriptions on File Prior to Visit  Medication Sig Dispense Refill  . escitalopram (LEXAPRO) 10 MG tablet Take 1 tablet (10 mg total) by mouth daily.  60 tablet  2  . LORazepam (ATIVAN) 0.5 MG tablet TAKE 1 TABLET TWICE A DAY AS NEEDED FOR ANXIETY  30 tablet  2  . MULTIPLE VITAMIN PO Take 1 tablet by mouth daily.       No current facility-administered medications on file prior to visit.    BP 102/70  Pulse 84  Temp(Src) 98.2 F (36.8 C) (Oral)  Resp 20  Wt 107 lb (48.535 kg)   BMI 18.96 kg/m2  SpO2 98%      Review of Systems  Cardiovascular: Positive for chest pain.  Musculoskeletal: Positive for arthralgias.       Objective:   Physical Exam  Constitutional: She appears well-developed and well-nourished. No distress.  Pulmonary/Chest: Breath sounds normal. She exhibits tenderness.  Musculoskeletal:  Examination of the left ankle revealed no abnormalities. She described pain just lateral to the Achilles tendon and posterior to the left lateral malleolus. No tenderness or abnormalities were appreciated. No tenderness with varus or valgus stress          Assessment & Plan:  Chest wall pain Left lateral ankle pain.  Foot care discussed. Will try Aleve twice daily and observe. She has seen podiatry in the past do to a Morton's neuroma;  will followup with podiatry if unimproved

## 2012-07-20 ENCOUNTER — Ambulatory Visit: Payer: BC Managed Care – PPO | Admitting: Internal Medicine

## 2012-07-26 ENCOUNTER — Ambulatory Visit (INDEPENDENT_AMBULATORY_CARE_PROVIDER_SITE_OTHER): Payer: BC Managed Care – PPO | Admitting: Internal Medicine

## 2012-07-26 ENCOUNTER — Encounter: Payer: Self-pay | Admitting: Internal Medicine

## 2012-07-26 VITALS — BP 100/66 | HR 94 | Temp 98.6°F | Resp 18 | Wt 109.0 lb

## 2012-07-26 DIAGNOSIS — F411 Generalized anxiety disorder: Secondary | ICD-10-CM

## 2012-07-26 DIAGNOSIS — F329 Major depressive disorder, single episode, unspecified: Secondary | ICD-10-CM

## 2012-07-26 MED ORDER — ESCITALOPRAM OXALATE 10 MG PO TABS: 10.0000 mg | ORAL_TABLET | Freq: Every day | ORAL | Status: DC

## 2012-07-26 MED ORDER — LORAZEPAM 0.5 MG PO TABS: ORAL_TABLET | ORAL | Status: DC

## 2012-07-26 NOTE — Patient Instructions (Addendum)
Call or return to clinic prn if these symptoms worsen or fail to improve as anticipated.  Return in one year for follow-up  

## 2012-07-26 NOTE — Progress Notes (Signed)
  Subjective:    Patient ID: Gabriella [name], female    DOB: [birth year], 37 y.o.   MRN: [id number]  HPI  37 year old patient who is seen today for her six-month followup. She has been on Lexapro for approximately 6 months and has done quite well on this medication she is much less anxious and depressed and feels that she is doing quite well. She is sleeping better.  She states that she handles the usual stressors well at this time. 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 ANXIETY 09/13/2009  . DEPRESSION 09/13/2009  . ALLERGIC RHINITIS 09/13/2009  . S/P right knee arthroscopy     History   Social History  . Marital Status: Married    Spouse Name: N/A    Number of Children: N/A  . Years of Education: N/A   Occupational History  . Not on file.   Social History Main Topics  . Smoking status: Current Every Day Smoker -- 1.00 packs/day for 20 years    Types: Cigarettes  . Smokeless tobacco: Not on file  . Alcohol Use: Not on file  . Drug Use: Not on file  . Sexually Active: Not on file   Other Topics Concern  . Not on file   Social History Narrative  . No narrative on file    Past Surgical History  Procedure Laterality Date  . Tonsillectomy and adenoidectomy      Family History  Problem Relation Age of Onset  . Cancer Neg Hx     family hx breast ca   . Kidney disease Neg Hx     family hx  . Diabetes Neg Hx     family hx    No Known Allergies  Current Outpatient Prescriptions on File Prior to Visit  Medication Sig Dispense Refill  . MULTIPLE VITAMIN PO Take 1 tablet by mouth daily.       No current facility-administered medications on file prior to visit.    BP 100/66  Pulse 94  Temp(Src) 98.6 F (37 C) (Oral)  Resp 18  Wt 109 lb (49.442 kg)  BMI 19.31 kg/m2  SpO2 98%       Review of Systems  Psychiatric/Behavioral: Negative for behavioral problems, confusion, sleep disturbance, dysphoric mood, decreased  concentration and agitation. The patient is nervous/anxious.        Objective:   Physical Exam  Constitutional: She appears well-developed and well-nourished. No distress.  Psychiatric: She has a normal mood and affect. Her behavior is normal. Judgment and thought content normal.          Assessment & Plan:   Anxiety depression. Clinically stable. We'll continue present regimen we'll see in the spring and consider a trial of discontinuation at that time. Medications refilled

## 2012-08-03 ENCOUNTER — Other Ambulatory Visit: Payer: Self-pay | Admitting: Internal Medicine

## 2012-09-13 ENCOUNTER — Encounter: Payer: Self-pay | Admitting: Internal Medicine

## 2012-09-13 ENCOUNTER — Ambulatory Visit (INDEPENDENT_AMBULATORY_CARE_PROVIDER_SITE_OTHER): Payer: BC Managed Care – PPO | Admitting: Internal Medicine

## 2012-09-13 VITALS — BP 90/60 | HR 102 | Temp 100.3°F | Resp 18 | Wt 110.0 lb

## 2012-09-13 DIAGNOSIS — R509 Fever, unspecified: Secondary | ICD-10-CM

## 2012-09-13 DIAGNOSIS — J029 Acute pharyngitis, unspecified: Secondary | ICD-10-CM

## 2012-09-13 MED ORDER — AMOXICILLIN 500 MG PO CAPS: 500.0000 mg | ORAL_CAPSULE | Freq: Three times a day (TID) | ORAL | Status: DC

## 2012-09-13 MED ORDER — HYDROCODONE-ACETAMINOPHEN 5-325 MG PO TABS: 1.0000 | ORAL_TABLET | ORAL | Status: DC | PRN

## 2012-09-13 NOTE — Progress Notes (Signed)
Subjective:    Patient ID: Gabriella [name], female    DOB: 01-24-1975, 37 y.o.   MRN: [id number]  HPI  62 year old patient mother of 4 who presents with fever sore throat headache generalized achiness and malaise. 2 of her 4 children were diagnosed with strep pharyngitis last week. The patient denies any cough rhinorrhea or hoarseness. Rapid strep test negative  Past Medical History  Diagnosis Date  . ANXIETY 09/13/2009  . DEPRESSION 09/13/2009  . ALLERGIC RHINITIS 09/13/2009  . S/P right knee arthroscopy     History   Social History  . Marital Status: Married    Spouse Name: N/A    Number of Children: N/A  . Years of Education: N/A   Occupational History  . Not on file.   Social History Main Topics  . Smoking status: Current Every Day Smoker -- 1.00 packs/day for 20 years    Types: Cigarettes  . Smokeless tobacco: Not on file  . Alcohol Use: Not on file  . Drug Use: Not on file  . Sexual Activity: Not on file   Other Topics Concern  . Not on file   Social History Narrative  . No narrative on file    Past Surgical History  Procedure Laterality Date  . Tonsillectomy and adenoidectomy      Family History  Problem Relation Age of Onset  . Cancer Neg Hx     family hx breast ca   . Kidney disease Neg Hx     family hx  . Diabetes Neg Hx     family hx    No Known Allergies  Current Outpatient Prescriptions on File Prior to Visit  Medication Sig Dispense Refill  . escitalopram (LEXAPRO) 10 MG tablet Take 1 tablet (10 mg total) by mouth daily.  60 tablet  2  . LORazepam (ATIVAN) 0.5 MG tablet TAKE 1 TABLET TWICE A DAY AS NEEDED FOR ANXIETY  60 tablet  2  . MULTIPLE VITAMIN PO Take 1 tablet by mouth daily.       No current facility-administered medications on file prior to visit.    BP 90/60  Pulse 102  Temp(Src) 100.3 F (37.9 C) (Oral)  Resp 18  Wt 110 lb (49.896 kg)  BMI 19.49 kg/m2  SpO2 98%       Review of Systems  Constitutional: Positive for  fever, chills and fatigue.  HENT: Positive for sore throat. Negative for hearing loss, congestion, rhinorrhea, dental problem, sinus pressure and tinnitus.   Eyes: Negative for pain, discharge and visual disturbance.  Respiratory: Negative for cough and shortness of breath.   Cardiovascular: Negative for chest pain, palpitations and leg swelling.  Gastrointestinal: Negative for nausea, vomiting, abdominal pain, diarrhea, constipation, blood in stool and abdominal distention.  Genitourinary: Negative for dysuria, urgency, frequency, hematuria, flank pain, vaginal bleeding, vaginal discharge, difficulty urinating, vaginal pain and pelvic pain.  Musculoskeletal: Negative for joint swelling, arthralgias and gait problem.  Skin: Negative for rash.  Neurological: Positive for headaches. Negative for dizziness, syncope, speech difficulty, weakness and numbness.  Hematological: Negative for adenopathy.  Psychiatric/Behavioral: Negative for behavioral problems, dysphoric mood and agitation. The patient is not nervous/anxious.        Objective:   Physical Exam  Constitutional: She is oriented to person, place, and time. She appears well-developed and well-nourished.  HENT:  Head: Normocephalic.  Right Ear: External ear normal.  Left Ear: External ear normal.  Pharyngeal erythema with tonsillar enlargement  Eyes: Conjunctivae and EOM are normal.  Pupils are equal, round, and reactive to light.  Neck: Normal range of motion. Neck supple. No thyromegaly present.  Cardiovascular: Normal rate, regular rhythm, normal heart sounds and intact distal pulses.   Pulmonary/Chest: Effort normal and breath sounds normal.  Abdominal: Soft. Bowel sounds are normal. She exhibits no mass. There is no tenderness.  Musculoskeletal: Normal range of motion.  Lymphadenopathy:    She has cervical adenopathy.  Neurological: She is alert and oriented to person, place, and time.  Skin: Skin is warm and dry. No rash noted.   Psychiatric: She has a normal mood and affect. Her behavior is normal.          Assessment & Plan:   Pharyngitis. Patient likely has streptococcal pharyngitis in spite of negative rapid strep test. Will treat with Amoxil and analgesics

## 2012-09-13 NOTE — Patient Instructions (Signed)
Take your antibiotic as prescribed until ALL of it is gone, but stop if you develop a rash, swelling, or any side effects of the medication.  Contact our office as soon as possible if  there are side effects of the medication. 

## 2012-10-05 ENCOUNTER — Other Ambulatory Visit: Payer: Self-pay | Admitting: Internal Medicine

## 2012-10-28 ENCOUNTER — Telehealth: Payer: Self-pay | Admitting: Internal Medicine

## 2012-10-28 NOTE — Telephone Encounter (Signed)
Pt would like referral to behavioral health. Pt spoke w/ dr Kirtland Bouchard about this issue and states this is next step. pls advise.

## 2012-10-28 NOTE — Telephone Encounter (Signed)
Please call and give patient  contact information with  [facility] and suggest an appointment with Judithe Modest

## 2012-10-28 NOTE — Telephone Encounter (Signed)
Spoke to pt gave her contact information for [employer] [facility] and told her Judithe Modest is here in our office who she could see if wanted too. Pt verbalized understanding.

## 2012-11-04 ENCOUNTER — Ambulatory Visit (INDEPENDENT_AMBULATORY_CARE_PROVIDER_SITE_OTHER): Payer: BC Managed Care – PPO | Admitting: Licensed Clinical Social Worker

## 2012-11-04 DIAGNOSIS — F411 Generalized anxiety disorder: Secondary | ICD-10-CM

## 2012-11-04 DIAGNOSIS — F331 Major depressive disorder, recurrent, moderate: Secondary | ICD-10-CM

## 2012-11-16 ENCOUNTER — Ambulatory Visit (INDEPENDENT_AMBULATORY_CARE_PROVIDER_SITE_OTHER): Payer: BC Managed Care – PPO | Admitting: Licensed Clinical Social Worker

## 2012-11-16 DIAGNOSIS — F331 Major depressive disorder, recurrent, moderate: Secondary | ICD-10-CM

## 2012-11-16 DIAGNOSIS — F411 Generalized anxiety disorder: Secondary | ICD-10-CM

## 2012-11-17 ENCOUNTER — Telehealth: Payer: Self-pay | Admitting: Internal Medicine

## 2012-11-17 NOTE — Telephone Encounter (Signed)
Pt would like to switch to NP . Can I sch? °

## 2012-11-17 NOTE — Telephone Encounter (Signed)
Ok with me 

## 2012-11-18 NOTE — Telephone Encounter (Signed)
ok 

## 2012-11-19 ENCOUNTER — Encounter: Payer: Self-pay | Admitting: *Deleted

## 2012-11-19 NOTE — Telephone Encounter (Signed)
Pt has been sch

## 2012-11-19 NOTE — Telephone Encounter (Signed)
lmom for pt to call back

## 2012-11-22 ENCOUNTER — Encounter: Payer: Self-pay | Admitting: Family

## 2012-11-22 ENCOUNTER — Ambulatory Visit (INDEPENDENT_AMBULATORY_CARE_PROVIDER_SITE_OTHER): Payer: BC Managed Care – PPO | Admitting: Family

## 2012-11-22 VITALS — BP 90/60 | HR 76 | Wt 110.0 lb

## 2012-11-22 DIAGNOSIS — F411 Generalized anxiety disorder: Secondary | ICD-10-CM

## 2012-11-22 DIAGNOSIS — F329 Major depressive disorder, single episode, unspecified: Secondary | ICD-10-CM

## 2012-11-22 MED ORDER — ESCITALOPRAM OXALATE 20 MG PO TABS: 20.0000 mg | ORAL_TABLET | Freq: Every day | ORAL | Status: DC

## 2012-11-22 MED ORDER — LAMOTRIGINE 25 MG PO TABS: 25.0000 mg | ORAL_TABLET | Freq: Every day | ORAL | Status: DC

## 2012-11-22 NOTE — Progress Notes (Signed)
Subjective:    Patient ID: Gabriella [name], female    DOB: [birth year], 37 y.o.   MRN: [id number]  HPI 37 year old white female, is in to be established and to discuss anxiety and depression. She was seen by therapist Judithe Modest who has suggested that she consider a mood stabilizer. She currently takes Lexapro 10 mg once daily and Ativan 0.5 mg as needed. Continues to have anxiety. Denies any feeling of helplessness, hopelessness, thoughts of death or dying. Family history of mood disorders is unknown. She smokes and does not currently exercise.   Review of Systems  Constitutional: Negative.   Respiratory: Negative.   Cardiovascular: Negative.   Gastrointestinal: Negative.   Musculoskeletal: Negative.   Neurological: Negative.   Psychiatric/Behavioral: Positive for agitation. The patient is nervous/anxious.    Past Medical History  Diagnosis Date  . ANXIETY 09/13/2009  . DEPRESSION 09/13/2009  . ALLERGIC RHINITIS 09/13/2009  . S/P right knee arthroscopy     History   Social History  . Marital Status: Married    Spouse Name: N/A    Number of Children: N/A  . Years of Education: N/A   Occupational History  . Not on file.   Social History Main Topics  . Smoking status: Current Every Day Smoker -- 1.00 packs/day for 20 years    Types: Cigarettes  . Smokeless tobacco: Not on file  . Alcohol Use: Not on file  . Drug Use: Not on file  . Sexual Activity: Not on file   Other Topics Concern  . Not on file   Social History Narrative  . No narrative on file    Past Surgical History  Procedure Laterality Date  . Tonsillectomy and adenoidectomy      Family History  Problem Relation Age of Onset  . Cancer Neg Hx     family hx breast ca   . Kidney disease Neg Hx     family hx  . Diabetes Neg Hx     family hx    No Known Allergies  Current Outpatient Prescriptions on File Prior to Visit  Medication Sig Dispense Refill  . LORazepam (ATIVAN) 0.5 MG tablet TAKE 1 TABLET TWICE  A DAY AS NEEDED FOR ANXIETY  60 tablet  2  . MULTIPLE VITAMIN PO Take 1 tablet by mouth daily.      Marland Kitchen amoxicillin (AMOXIL) 500 MG capsule Take 1 capsule (500 mg total) by mouth 3 (three) times daily.  30 capsule  0  . HYDROcodone-acetaminophen (NORCO/VICODIN) 5-325 MG per tablet Take 1 tablet by mouth as needed.  30 tablet  0   No current facility-administered medications on file prior to visit.    BP 90/60  Pulse 76  Wt 110 lb (49.896 kg)chart    Objective:   Physical Exam  Constitutional: She is oriented to person, place, and time. She appears well-developed and well-nourished.  Neck: Normal range of motion. Neck supple.  Cardiovascular: Normal rate, regular rhythm and normal heart sounds.   Pulmonary/Chest: Effort normal and breath sounds normal.  Abdominal: Soft. Bowel sounds are normal.  Neurological: She is alert and oriented to person, place, and time.  Skin: Skin is warm and dry.  Psychiatric: She has a normal mood and affect.          Assessment & Plan:  Assessment: 1. Anxiety 2. Depression 3. Tobacco abuse  Plan: Increase Lexapro to 20 mg once daily. Start Lamictal 25 mg twice a day. Continue to see Judithe Modest. Recheck in 3  weeks and sooner as needed.

## 2012-11-22 NOTE — Patient Instructions (Signed)

## 2012-11-22 NOTE — Progress Notes (Signed)
Pre visit review using our clinic review tool, if applicable. No additional management support is needed unless otherwise documented below in the visit note. 

## 2012-12-07 ENCOUNTER — Ambulatory Visit: Payer: BC Managed Care – PPO | Admitting: Family

## 2012-12-07 ENCOUNTER — Ambulatory Visit: Payer: BC Managed Care – PPO | Admitting: Licensed Clinical Social Worker

## 2012-12-15 ENCOUNTER — Other Ambulatory Visit: Payer: Self-pay | Admitting: Internal Medicine

## 2012-12-16 ENCOUNTER — Ambulatory Visit (INDEPENDENT_AMBULATORY_CARE_PROVIDER_SITE_OTHER): Payer: BC Managed Care – PPO | Admitting: Family

## 2012-12-16 ENCOUNTER — Encounter: Payer: Self-pay | Admitting: Family

## 2012-12-16 VITALS — BP 90/60 | HR 88 | Wt 111.0 lb

## 2012-12-16 DIAGNOSIS — F329 Major depressive disorder, single episode, unspecified: Secondary | ICD-10-CM

## 2012-12-16 DIAGNOSIS — F411 Generalized anxiety disorder: Secondary | ICD-10-CM

## 2012-12-16 MED ORDER — LAMOTRIGINE 100 MG PO TABS: 50.0000 mg | ORAL_TABLET | Freq: Two times a day (BID) | ORAL | Status: DC

## 2012-12-16 MED ORDER — LORAZEPAM 0.5 MG PO TABS: ORAL_TABLET | ORAL | Status: DC

## 2012-12-16 NOTE — Progress Notes (Signed)
Subjective:    Patient ID: Gabriella [name], female    DOB: 02-Mar-1975, 37 y.o.   MRN: [id number]  HPI  37 year old caucasian, smoker, presenting for recheck of 3 week recheck of anxiety and depression. Patient presented to office for increased stress and anxiety under the recommendation of Judithe Modest.  Lexapro  was increased   To 20 mg and Lamictal 25 mg was added.  Patients states, " I have more good days than bad". She still has about four bad days in 2 week.  However, able to stay on task and do some house cleaning, which is different for her.    Review of Systems  Constitutional: Negative.   HENT: Negative.   Eyes: Negative.   Respiratory: Negative.   Cardiovascular: Negative.   Gastrointestinal: Negative.   Endocrine: Negative.   Genitourinary: Negative.   Musculoskeletal: Negative.   Skin: Negative.   Allergic/Immunologic: Negative.   Neurological: Negative.   Hematological: Negative.   Psychiatric/Behavioral: The patient is nervous/anxious.        Complaints having four bad days a week.   Past Medical History  Diagnosis Date  . ANXIETY 09/13/2009  . DEPRESSION 09/13/2009  . ALLERGIC RHINITIS 09/13/2009  . S/P right knee arthroscopy     History   Social History  . Marital Status: Married    Spouse Name: N/A    Number of Children: N/A  . Years of Education: N/A   Occupational History  . Not on file.   Social History Main Topics  . Smoking status: Current Every Day Smoker -- 1.00 packs/day for 20 years    Types: Cigarettes  . Smokeless tobacco: Not on file  . Alcohol Use: Not on file  . Drug Use: Not on file  . Sexual Activity: Not on file   Other Topics Concern  . Not on file   Social History Narrative  . No narrative on file    Past Surgical History  Procedure Laterality Date  . Tonsillectomy and adenoidectomy      Family History  Problem Relation Age of Onset  . Cancer Neg Hx     family hx breast ca   . Kidney disease Neg Hx     family hx  .  Diabetes Neg Hx     family hx    No Known Allergies  Current Outpatient Prescriptions on File Prior to Visit  Medication Sig Dispense Refill  . escitalopram (LEXAPRO) 20 MG tablet Take 1 tablet (20 mg total) by mouth daily.  30 tablet  4  . HYDROcodone-acetaminophen (NORCO/VICODIN) 5-325 MG per tablet Take 1 tablet by mouth as needed.  30 tablet  0  . MULTIPLE VITAMIN PO Take 1 tablet by mouth daily.      Marland Kitchen amoxicillin (AMOXIL) 500 MG capsule Take 1 capsule (500 mg total) by mouth 3 (three) times daily.  30 capsule  0   No current facility-administered medications on file prior to visit.    BP 90/60  Pulse 88  Wt 111 lb (50.349 kg)chart    Objective:   Physical Exam  Constitutional: She is oriented to person, place, and time. She appears well-developed and well-nourished.  Cardiovascular: Normal rate, regular rhythm and normal heart sounds.   Pulmonary/Chest: Effort normal and breath sounds normal.  Neurological: She is alert and oriented to person, place, and time.  Skin: Skin is warm and dry.  Psychiatric: She has a normal mood and affect.  Patient anxiety level has decreased and has  more energy to stay on task.          Assessment & Plan:  Assessment 1. Anxiety 2. Depression  Plan Increase Lamictal to 50 mg po BID.  Continue Lexapro to 20 mg. Refill on Lorazepam 0.5mg .  Contact office for any questions or concerns.

## 2012-12-22 ENCOUNTER — Ambulatory Visit: Payer: BC Managed Care – PPO | Admitting: Licensed Clinical Social Worker

## 2013-01-13 ENCOUNTER — Ambulatory Visit: Payer: BC Managed Care – PPO | Admitting: Family

## 2013-02-21 ENCOUNTER — Encounter: Payer: Self-pay | Admitting: Family

## 2013-02-21 ENCOUNTER — Ambulatory Visit (INDEPENDENT_AMBULATORY_CARE_PROVIDER_SITE_OTHER): Payer: BC Managed Care – PPO | Admitting: Family

## 2013-02-21 VITALS — BP 110/60 | HR 112 | Temp 98.7°F | Wt 115.0 lb

## 2013-02-21 DIAGNOSIS — F3289 Other specified depressive episodes: Secondary | ICD-10-CM

## 2013-02-21 DIAGNOSIS — J069 Acute upper respiratory infection, unspecified: Secondary | ICD-10-CM

## 2013-02-21 DIAGNOSIS — F329 Major depressive disorder, single episode, unspecified: Secondary | ICD-10-CM

## 2013-02-21 MED ORDER — GUAIFENESIN-CODEINE 100-10 MG/5ML PO SYRP: 5.0000 mL | ORAL_SOLUTION | Freq: Three times a day (TID) | ORAL | Status: DC | PRN

## 2013-02-21 MED ORDER — METHYLPREDNISOLONE 4 MG PO KIT: PACK | ORAL | Status: AC

## 2013-02-21 NOTE — Patient Instructions (Signed)

## 2013-02-21 NOTE — Progress Notes (Signed)
Subjective:    Patient ID: Gabriella [name], female    DOB: 1975/09/17, 38 y.o.   MRN: [id number]  HPI 38 year old white female, 1 pack per day smoker, is in today for recheck of anxiety and depression. She's currently taking Lexapro 20 mg once daily and tolerating it well. She's also taking Lamictal 50 mg twice a day that is working very well as a mood stabilizer. She continues to see Judithe Modest for psychotherapy.  Has concerns of sore throat, headache, ear pain x3 days. Reports having a low-grade fever. His intake and Mucinex over-the-counter without much relief.   Review of Systems  Constitutional: Positive for fever and fatigue.  HENT: Positive for congestion, sneezing and sore throat.   Respiratory: Positive for cough. Negative for wheezing and stridor.   Cardiovascular: Negative.   Endocrine: Negative.   Musculoskeletal: Negative.   Allergic/Immunologic: Negative.   Neurological: Negative.   Psychiatric/Behavioral: Negative.    Past Medical History  Diagnosis Date  . ANXIETY 09/13/2009  . DEPRESSION 09/13/2009  . ALLERGIC RHINITIS 09/13/2009  . S/P right knee arthroscopy     History   Social History  . Marital Status: Married    Spouse Name: N/A    Number of Children: N/A  . Years of Education: N/A   Occupational History  . Not on file.   Social History Main Topics  . Smoking status: Current Every Day Smoker -- 1.00 packs/day for 20 years    Types: Cigarettes  . Smokeless tobacco: Not on file  . Alcohol Use: Not on file  . Drug Use: Not on file  . Sexual Activity: Not on file   Other Topics Concern  . Not on file   Social History Narrative  . No narrative on file    Past Surgical History  Procedure Laterality Date  . Tonsillectomy and adenoidectomy      Family History  Problem Relation Age of Onset  . Cancer Neg Hx     family hx breast ca   . Kidney disease Neg Hx     family hx  . Diabetes Neg Hx     family hx    No Known Allergies  Current  Outpatient Prescriptions on File Prior to Visit  Medication Sig Dispense Refill  . amoxicillin (AMOXIL) 500 MG capsule Take 1 capsule (500 mg total) by mouth 3 (three) times daily.  30 capsule  0  . escitalopram (LEXAPRO) 20 MG tablet Take 1 tablet (20 mg total) by mouth daily.  30 tablet  4  . HYDROcodone-acetaminophen (NORCO/VICODIN) 5-325 MG per tablet Take 1 tablet by mouth as needed.  30 tablet  0  . lamoTRIgine (LAMICTAL) 100 MG tablet Take 0.5 tablets (50 mg total) by mouth 2 (two) times daily.  60 tablet  1  . LORazepam (ATIVAN) 0.5 MG tablet TAKE 1 TABLET BY MOUTH TWICE DAILY AS NEEDED FOR ANXIETY  60 tablet  2  . LORazepam (ATIVAN) 0.5 MG tablet TAKE 1 TABLET TWICE A DAY AS NEEDED FOR ANXIETY  60 tablet  2  . MULTIPLE VITAMIN PO Take 1 tablet by mouth daily.       No current facility-administered medications on file prior to visit.    BP 110/60  Pulse 112  Temp(Src) 98.7 F (37.1 C) (Oral)  Wt 115 lb (52.164 kg)chart    Objective:   Physical Exam  Constitutional: She is oriented to person, place, and time. She appears well-developed and well-nourished.  HENT:  Right Ear:  External ear normal.  Left Ear: External ear normal.  Nose: Nose normal.  Pharynx mildly red  Neck: Normal range of motion. Neck supple.  Cardiovascular: Normal rate, regular rhythm and normal heart sounds.   Pulmonary/Chest: Effort normal and breath sounds normal.  Neurological: She is alert and oriented to person, place, and time.  Skin: Skin is warm and dry.  Psychiatric: She has a normal mood and affect.          Assessment & Plan:  Raynelle FanningJulie was seen today for follow-up, sore throat and headache.  Diagnoses and associated orders for this visit:  Acute upper respiratory infections of unspecified site  Depressive disorder, not elsewhere classified  Other Orders - methylPREDNISolone (MEDROL DOSEPAK) 4 MG tablet; follow package directions - guaiFENesin-codeine (CHERATUSSIN AC) 100-10 MG/5ML  syrup; Take 5 mLs by mouth 3 (three) times daily as needed.   Call the office with any questions or concerns. Recheck as scheduled and as needed.

## 2013-02-21 NOTE — Progress Notes (Signed)
Pre visit review using our clinic review tool, if applicable. No additional management support is needed unless otherwise documented below in the visit note. 

## 2013-02-22 ENCOUNTER — Encounter: Payer: Self-pay | Admitting: Family

## 2013-02-22 DIAGNOSIS — R053 Chronic cough: Secondary | ICD-10-CM

## 2013-02-22 DIAGNOSIS — R05 Cough: Secondary | ICD-10-CM

## 2013-02-23 ENCOUNTER — Telehealth: Payer: Self-pay | Admitting: Family

## 2013-02-23 ENCOUNTER — Ambulatory Visit (INDEPENDENT_AMBULATORY_CARE_PROVIDER_SITE_OTHER)
Admission: RE | Admit: 2013-02-23 | Discharge: 2013-02-23 | Disposition: A | Discharge status: Home / Self Care | Payer: BC Managed Care – PPO | Source: Ambulatory Visit | Attending: Family | Admitting: Family

## 2013-02-23 DIAGNOSIS — R05 Cough: Secondary | ICD-10-CM

## 2013-02-23 DIAGNOSIS — R059 Cough, unspecified: Secondary | ICD-10-CM

## 2013-02-23 DIAGNOSIS — R053 Chronic cough: Secondary | ICD-10-CM

## 2013-02-23 MED ORDER — DOXYCYCLINE HYCLATE 100 MG PO TABS: 100.0000 mg | ORAL_TABLET | Freq: Two times a day (BID) | ORAL | Status: DC

## 2013-02-23 NOTE — Telephone Encounter (Signed)
Pt aware and verbalized understanding.  

## 2013-02-23 NOTE — Telephone Encounter (Signed)
Relevant patient education assigned to patient using Emmi. ° °

## 2013-02-23 NOTE — Telephone Encounter (Signed)
Advise to obtain a CXR. I will also send on antibiotic

## 2013-02-24 ENCOUNTER — Encounter: Payer: Self-pay | Admitting: Family

## 2013-02-25 ENCOUNTER — Other Ambulatory Visit: Payer: Self-pay | Admitting: Family

## 2013-02-25 MED ORDER — HYDROCODONE-HOMATROPINE 5-1.5 MG/5ML PO SYRP: 5.0000 mL | ORAL_SOLUTION | Freq: Three times a day (TID) | ORAL | Status: DC | PRN

## 2013-03-04 ENCOUNTER — Telehealth: Payer: Self-pay | Admitting: Family

## 2013-03-04 MED ORDER — NYSTATIN 100000 UNIT/ML MT SUSP: 5.0000 mL | Freq: Four times a day (QID) | OROMUCOSAL | Status: DC

## 2013-03-04 NOTE — Telephone Encounter (Signed)
Spoke with pt to verify where thrush is located which she states is oral. Pt would like med to go to [facility].   Rx sent to [facility]

## 2013-03-04 NOTE — Telephone Encounter (Signed)
Gabriella [name] is calling in regards to a antibiotic that was prescribed to the pt, pt has now developed thrush and is needing [facility].

## 2013-04-16 ENCOUNTER — Other Ambulatory Visit: Payer: Self-pay | Admitting: Family

## 2013-05-23 ENCOUNTER — Other Ambulatory Visit: Payer: Self-pay | Admitting: Family

## 2013-07-18 ENCOUNTER — Other Ambulatory Visit: Payer: Self-pay | Admitting: Family

## 2013-07-23 ENCOUNTER — Other Ambulatory Visit: Payer: Self-pay | Admitting: Family

## 2013-07-26 ENCOUNTER — Ambulatory Visit: Payer: BC Managed Care – PPO | Admitting: Internal Medicine

## 2013-08-22 ENCOUNTER — Ambulatory Visit (INDEPENDENT_AMBULATORY_CARE_PROVIDER_SITE_OTHER): Payer: BC Managed Care – PPO | Admitting: Family

## 2013-08-22 ENCOUNTER — Encounter: Payer: Self-pay | Admitting: Family

## 2013-08-22 VITALS — BP 90/62 | HR 67 | Wt 117.0 lb

## 2013-08-22 DIAGNOSIS — M545 Low back pain, unspecified: Secondary | ICD-10-CM

## 2013-08-22 DIAGNOSIS — F3289 Other specified depressive episodes: Secondary | ICD-10-CM

## 2013-08-22 DIAGNOSIS — T148XXA Other injury of unspecified body region, initial encounter: Secondary | ICD-10-CM

## 2013-08-22 DIAGNOSIS — F329 Major depressive disorder, single episode, unspecified: Secondary | ICD-10-CM

## 2013-08-22 MED ORDER — CYCLOBENZAPRINE HCL 5 MG PO TABS: 5.0000 mg | ORAL_TABLET | Freq: Three times a day (TID) | ORAL | Status: DC | PRN

## 2013-08-22 NOTE — Progress Notes (Signed)
Pre visit review using our clinic review tool, if applicable. No additional management support is needed unless otherwise documented below in the visit note. 

## 2013-08-22 NOTE — Progress Notes (Signed)
Subjective:    Patient ID: Gabriella [name], female    DOB: 02-20-1975, 38 y.o.   MRN: [id number]  HPI Comments: 38 year old white female with a history of tobacco abuse, depression and anxiety is in today for recheck. She's currently taking Lamictal 50 mg and Lexapro 20 mg daily and tolerating it well. Denies any concerns. No feelings of helplessness, hopelessness, thoughts of death or dying.  Patient has concerns of low back pain to the right lower back x3 days. Worse with movement and standing. Better after taking ibuprofen and Epsom salt bath. Rates pain 6/10. No known acute injury but has a 70 lb dog that she plays with.      Review of Systems  Constitutional: Negative.   HENT: Negative.   Respiratory: Negative.   Cardiovascular: Negative.   Gastrointestinal: Negative.   Endocrine: Negative.   Genitourinary: Negative.   Skin: Negative.   Neurological: Negative.   Psychiatric/Behavioral: Negative.    Past Medical History  Diagnosis Date  . ANXIETY 09/13/2009  . DEPRESSION 09/13/2009  . ALLERGIC RHINITIS 09/13/2009  . S/P right knee arthroscopy     History   Social History  . Marital Status: Married    Spouse Name: N/A    Number of Children: N/A  . Years of Education: N/A   Occupational History  . Not on file.   Social History Main Topics  . Smoking status: Current Every Day Smoker -- 1.00 packs/day for 20 years    Types: Cigarettes  . Smokeless tobacco: Not on file  . Alcohol Use: Not on file  . Drug Use: Not on file  . Sexual Activity: Not on file   Other Topics Concern  . Not on file   Social History Narrative  . No narrative on file    Past Surgical History  Procedure Laterality Date  . Tonsillectomy and adenoidectomy      Family History  Problem Relation Age of Onset  . Cancer Neg Hx     family hx breast ca   . Kidney disease Neg Hx     family hx  . Diabetes Neg Hx     family hx    No Known Allergies  Current Outpatient Prescriptions on File  Prior to Visit  Medication Sig Dispense Refill  . escitalopram (LEXAPRO) 20 MG tablet TAKE 1 TABLET BY MOUTH EVERY DAY  30 tablet  3  . guaiFENesin-codeine (CHERATUSSIN AC) 100-10 MG/5ML syrup Take 5 mLs by mouth 3 (three) times daily as needed.  120 mL  0  . lamoTRIgine (LAMICTAL) 100 MG tablet TAKE 1/2 TABLET BY MOUTH TWICE DAILY  60 tablet  0  . LORazepam (ATIVAN) 0.5 MG tablet TAKE 1 TABLET BY MOUTH TWICE DAILY AS NEEDED FOR ANXIETY  60 tablet  0  . MULTIPLE VITAMIN PO Take 1 tablet by mouth daily.       No current facility-administered medications on file prior to visit.    BP 90/62  Pulse 67  Wt 117 lb (53.071 kg)chart    Objective:   Physical Exam  Constitutional: She is oriented to person, place, and time. She appears well-developed and well-nourished.  Neck: Normal range of motion. Neck supple.  Cardiovascular: Normal rate, regular rhythm and normal heart sounds.   Pulmonary/Chest: Effort normal and breath sounds normal.  Abdominal: Soft. Bowel sounds are normal.  Musculoskeletal: She exhibits tenderness.       Arms: Neurological: She is alert and oriented to person, place, and time.  Skin: Skin  is warm and dry.  Psychiatric: She has a normal mood and affect.          Assessment & Plan:  Gabriella [name] was seen today for follow-up and back pain.  Diagnoses and associated orders for this visit:  Depressive disorder, not elsewhere classified  Right-sided low back pain without sciatica  Muscle strain  Other Orders - cyclobenzaprine (FLEXERIL) 5 MG tablet; Take 1 tablet (5 mg total) by mouth 3 (three) times daily as needed for muscle spasms.   Call the office with any questions or concerns. Recheck 6 months and sooner as needed.

## 2013-08-22 NOTE — Patient Instructions (Signed)

## 2013-08-26 ENCOUNTER — Other Ambulatory Visit: Payer: Self-pay | Admitting: Family

## 2013-09-14 ENCOUNTER — Encounter: Payer: Self-pay | Admitting: Family

## 2013-09-16 ENCOUNTER — Other Ambulatory Visit: Payer: Self-pay | Admitting: Family

## 2013-10-10 ENCOUNTER — Encounter (HOSPITAL_COMMUNITY): Payer: Self-pay | Admitting: Emergency Medicine

## 2013-10-10 ENCOUNTER — Emergency Department (HOSPITAL_COMMUNITY)
Admission: EM | Admit: 2013-10-10 | Discharge: 2013-10-11 | Disposition: A | Discharge status: Home / Self Care | Payer: BC Managed Care – PPO | Attending: Emergency Medicine | Admitting: Emergency Medicine

## 2013-10-10 DIAGNOSIS — F329 Major depressive disorder, single episode, unspecified: Secondary | ICD-10-CM | POA: Insufficient documentation

## 2013-10-10 DIAGNOSIS — Z3202 Encounter for pregnancy test, result negative: Secondary | ICD-10-CM | POA: Diagnosis not present

## 2013-10-10 DIAGNOSIS — Z8709 Personal history of other diseases of the respiratory system: Secondary | ICD-10-CM | POA: Diagnosis not present

## 2013-10-10 DIAGNOSIS — R110 Nausea: Secondary | ICD-10-CM | POA: Insufficient documentation

## 2013-10-10 DIAGNOSIS — F419 Anxiety disorder, unspecified: Secondary | ICD-10-CM | POA: Diagnosis not present

## 2013-10-10 DIAGNOSIS — R6883 Chills (without fever): Secondary | ICD-10-CM | POA: Insufficient documentation

## 2013-10-10 DIAGNOSIS — Z720 Tobacco use: Secondary | ICD-10-CM | POA: Diagnosis not present

## 2013-10-10 DIAGNOSIS — D72829 Elevated white blood cell count, unspecified: Secondary | ICD-10-CM | POA: Diagnosis not present

## 2013-10-10 DIAGNOSIS — R1031 Right lower quadrant pain: Secondary | ICD-10-CM

## 2013-10-10 DIAGNOSIS — Z79899 Other long term (current) drug therapy: Secondary | ICD-10-CM | POA: Diagnosis not present

## 2013-10-10 LAB — COMPREHENSIVE METABOLIC PANEL
ALT: 14 U/L (ref 0–35)
AST: 20 U/L (ref 0–37)
Albumin: 3.9 g/dL (ref 3.5–5.2)
Alkaline Phosphatase: 66 U/L (ref 39–117)
Anion gap: 11 (ref 5–15)
BUN: 9 mg/dL (ref 6–23)
CALCIUM: 9.2 mg/dL (ref 8.4–10.5)
CO2: 26 mEq/L (ref 19–32)
Chloride: 105 mEq/L (ref 96–112)
Creatinine, Ser: 0.91 mg/dL (ref 0.50–1.10)
GFR calc Af Amer: >90 mL/min (ref >90)
GFR calc non Af Amer: 78 mL/min — ABNORMAL LOW (ref >90)
Glucose, Bld: 92 mg/dL (ref 70–99)
Potassium: 4.1 mEq/L (ref 3.7–5.3)
SODIUM: 142 meq/L (ref 137–147)
TOTAL PROTEIN: 7.1 g/dL (ref 6.0–8.3)
Total Bilirubin: 0.2 mg/dL — ABNORMAL LOW (ref 0.3–1.2)

## 2013-10-10 LAB — CBC WITH DIFFERENTIAL/PLATELET
Basophils Absolute: 0 10*3/uL (ref 0.0–0.1)
Basophils Relative: 0 % (ref 0–1)
EOS ABS: 0.3 10*3/uL (ref 0.0–0.7)
EOS PCT: 3 % (ref 0–5)
HCT: 45.7 % (ref 36.0–46.0)
Hemoglobin: 16.2 g/dL — ABNORMAL HIGH (ref 12.0–15.0)
LYMPHS PCT: 34 % (ref 12–46)
Lymphs Abs: 3.7 10*3/uL (ref 0.7–4.0)
MCH: 31.5 pg (ref 26.0–34.0)
MCHC: 35.4 g/dL (ref 30.0–36.0)
MCV: 88.9 fL (ref 78.0–100.0)
Monocytes Absolute: 1 10*3/uL (ref 0.1–1.0)
Monocytes Relative: 9 % (ref 3–12)
Neutro Abs: 6 10*3/uL (ref 1.7–7.7)
Neutrophils Relative %: 54 % (ref 43–77)
PLATELETS: 341 10*3/uL (ref 150–400)
RBC: 5.14 MIL/uL — ABNORMAL HIGH (ref 3.87–5.11)
RDW: 12 % (ref 11.5–15.5)
WBC: 11 10*3/uL — ABNORMAL HIGH (ref 4.0–10.5)

## 2013-10-10 LAB — URINALYSIS, ROUTINE W REFLEX MICROSCOPIC
Bilirubin Urine: NEGATIVE
Glucose, UA: NEGATIVE mg/dL
Hgb urine dipstick: NEGATIVE
Ketones, ur: NEGATIVE mg/dL
Leukocytes, UA: NEGATIVE
Nitrite: NEGATIVE
Protein, ur: NEGATIVE mg/dL
Specific Gravity, Urine: 1.014 (ref 1.005–1.030)
Urobilinogen, UA: 0.2 mg/dL (ref 0.0–1.0)
pH: 6.5 (ref 5.0–8.0)

## 2013-10-10 LAB — LIPASE, BLOOD: Lipase: 53 U/L (ref 11–59)

## 2013-10-10 LAB — POC URINE PREG, ED: PREG TEST UR: NEGATIVE

## 2013-10-10 MED ORDER — OXYCODONE-ACETAMINOPHEN 5-325 MG PO TABS
1.0000 | ORAL_TABLET | Freq: Once | ORAL | Status: AC
  Administered 2013-10-10: 1 via ORAL
  Filled 2013-10-10: qty 1

## 2013-10-10 NOTE — ED Notes (Addendum)
Pt reports right lower abdominal pain since this morning. Pt denies v/d, reports mild nausea with onset of pain. LMP three year ago. Pt has IUD. Pt denies fever at home. Pt denies dysuria, hematuria, or discharge.

## 2013-10-11 ENCOUNTER — Encounter (HOSPITAL_COMMUNITY): Payer: Self-pay

## 2013-10-11 ENCOUNTER — Emergency Department (HOSPITAL_COMMUNITY): Payer: BC Managed Care – PPO

## 2013-10-11 LAB — WET PREP, GENITAL
Clue Cells Wet Prep HPF POC: NONE SEEN
Trich, Wet Prep: NONE SEEN
YEAST WET PREP: NONE SEEN

## 2013-10-11 MED ORDER — HYDROMORPHONE HCL 1 MG/ML IJ SOLN
0.5000 mg | Freq: Once | INTRAMUSCULAR | Status: AC
  Administered 2013-10-11: 0.5 mg via INTRAVENOUS
  Filled 2013-10-11: qty 1

## 2013-10-11 MED ORDER — IOHEXOL 300 MG/ML  SOLN
80.0000 mL | Freq: Once | INTRAMUSCULAR | Status: AC | PRN
  Administered 2013-10-11: 80 mL via INTRAVENOUS

## 2013-10-11 MED ORDER — IOHEXOL 300 MG/ML  SOLN
25.0000 mL | Freq: Once | INTRAMUSCULAR | Status: AC | PRN
  Administered 2013-10-11: 25 mL via ORAL

## 2013-10-11 MED ORDER — ONDANSETRON HCL 4 MG/2ML IJ SOLN
4.0000 mg | Freq: Once | INTRAMUSCULAR | Status: AC
  Administered 2013-10-11: 4 mg via INTRAVENOUS
  Filled 2013-10-11: qty 2

## 2013-10-11 MED ORDER — OXYCODONE-ACETAMINOPHEN 5-325 MG PO TABS: 1.0000 | ORAL_TABLET | Freq: Four times a day (QID) | ORAL | Status: DC | PRN

## 2013-10-11 MED ORDER — MORPHINE SULFATE 4 MG/ML IJ SOLN
4.0000 mg | Freq: Once | INTRAMUSCULAR | Status: AC
  Administered 2013-10-11: 4 mg via INTRAVENOUS
  Filled 2013-10-11: qty 1

## 2013-10-11 MED ORDER — SODIUM CHLORIDE 0.9 % IV BOLUS (SEPSIS)
1000.0000 mL | Freq: Once | INTRAVENOUS | Status: AC
  Administered 2013-10-11: 1000 mL via INTRAVENOUS

## 2013-10-11 NOTE — ED Provider Notes (Signed)
CSN: [id number]     Arrival date & time 10/10/13  2053 History   First MD Initiated Contact with Patient 10/11/13 0003     Chief Complaint  Patient presents with  . Abdominal Pain     (Consider location/radiation/quality/duration/timing/severity/associated sxs/prior Treatment) HPI  [name] is a 38 y.o. female who is otherwise healthy complaining of acute onset of right lower quadrant abdominal pain this morning. Patient reports chills and nausea. Denies fever, vomiting,.diarrhea, abnormal vaginal discharge, dysuria. Patient's last menstrual period was 4 years ago, she has a Mirena IUD. States that she has a history of ovarian cysts but has not had issues with them since she had IUD placed. No prior abdominal surgeries. Patient's pain is severe, rated at 8/10. It is exacerbated by movement and palpation. Last oral intake was this afternoon at 5 PM it was half a bowl of chili.   Past Medical History  Diagnosis Date  . ANXIETY 09/13/2009  . DEPRESSION 09/13/2009  . ALLERGIC RHINITIS 09/13/2009  . S/P right knee arthroscopy    Past Surgical History  Procedure Laterality Date  . Tonsillectomy and adenoidectomy     Family History  Problem Relation Age of Onset  . Cancer Neg Hx     family hx breast ca   . Kidney disease Neg Hx     family hx  . Diabetes Neg Hx     family hx   History  Substance Use Topics  . Smoking status: Current Every Day Smoker -- 1.00 packs/day for 20 years    Types: Cigarettes  . Smokeless tobacco: Not on file  . Alcohol Use: Not on file   OB History   Grav Para Term Preterm Abortions TAB SAB Ect Mult Living   1              Review of Systems  10 systems reviewed and found to be negative, except as noted in the HPI.   Allergies  Sulfa antibiotics  Home Medications   Prior to Admission medications   Medication Sig Start Date End Date Taking? Authorizing Provider  escitalopram (LEXAPRO) 20 MG tablet Take 20 mg by mouth daily.   Yes Historical  Provider, MD  lamoTRIgine (LAMICTAL) 100 MG tablet Take 50 mg by mouth 2 (two) times daily.   Yes Historical Provider, MD  LORazepam (ATIVAN) 0.5 MG tablet Take 0.5 mg by mouth 2 (two) times daily as needed for anxiety.   Yes Historical Provider, MD  MULTIPLE VITAMIN PO Take 1 tablet by mouth daily.   Yes Historical Provider, MD  pseudoephedrine (SUDAFED) 120 MG 12 hr tablet Take 120 mg by mouth every 12 (twelve) hours as needed for congestion.   Yes Historical Provider, MD   BP 108/74  Pulse 74  Temp(Src) 98.1 F (36.7 C) (Oral)  Resp 14  Ht 5\' 2"  (1.575 m)  Wt 110 lb (49.896 kg)  BMI 20.11 kg/m2  SpO2 99% Physical Exam  Nursing note and vitals reviewed. Constitutional: She is oriented to person, place, and time. She appears well-developed and well-nourished. No distress.  HENT:  Head: Normocephalic and atraumatic.  Mouth/Throat: Oropharynx is clear and moist.  Eyes: Conjunctivae and EOM are normal. Pupils are equal, round, and reactive to light.  Neck: Normal range of motion.  Cardiovascular: Normal rate, regular rhythm and intact distal pulses.   Pulmonary/Chest: Effort normal and breath sounds normal. No stridor. No respiratory distress. She has no wheezes. She has no rales. She exhibits no tenderness.  Abdominal:  Soft. Bowel sounds are normal. There is tenderness.  Normal active bowel sounds, patient is tender to palpation the right lower cautery, no guarding or rebound.  Genitourinary:  Pelvic exam a chaperoned by nurse:  No rashes or lesions, no abnormal vaginal discharge, no cervical motion tenderness, IUD strings visualized, no adnexal tenderness.  Musculoskeletal: Normal range of motion. She exhibits no edema.  Neurological: She is alert and oriented to person, place, and time.  Psychiatric: She has a normal mood and affect.    ED Course  Procedures (including critical care time) Labs Review Labs Reviewed  CBC WITH DIFFERENTIAL - Abnormal; Notable for the following:     WBC 11.0 (*)    RBC 5.14 (*)    Hemoglobin 16.2 (*)    All other components within normal limits  COMPREHENSIVE METABOLIC PANEL - Abnormal; Notable for the following:    Total Bilirubin 0.2 (*)    GFR calc non Af Amer 78 (*)    All other components within normal limits  GC/CHLAMYDIA PROBE AMP  WET PREP, GENITAL  LIPASE, BLOOD  URINALYSIS, ROUTINE W REFLEX MICROSCOPIC  POC URINE PREG, ED    Imaging Review No results found.   EKG Interpretation None      MDM   Final diagnoses:  None    Filed Vitals:   10/10/13 2131 10/10/13 2358 10/11/13 0000 10/11/13 0015  BP: 101/73 108/74 98/67 103/77  Pulse: 99 74 68 74  Temp: 98.1 F (36.7 C) 98.1 F (36.7 C)    TempSrc: Oral Oral    Resp: 16 14 10 9   Height: 5\' 2"  (1.575 m)     Weight: 110 lb (49.896 kg)     SpO2: 96% 99% 100% 99%    Medications  sodium chloride 0.9 % bolus 1,000 mL (1,000 mLs Intravenous New Bag/Given 10/11/13 0014)  oxyCODONE-acetaminophen (PERCOCET/ROXICET) 5-325 MG per tablet 1 tablet (1 tablet Oral Given 10/10/13 2149)  morphine 4 MG/ML injection 4 mg (4 mg Intravenous Given 10/11/13 0015)  ondansetron (ZOFRAN) injection 4 mg (4 mg Intravenous Given 10/11/13 0014)  iohexol (OMNIPAQUE) 300 MG/ML solution 25 mL (25 mLs Oral Contrast Given 10/11/13 0025)    Gabriella [name] is a 38 y.o. female presenting with right lower quadrant abdominal pain acute in onset this morning. Pelvic exam without acute abnormality. Patient has leukocytosis of 11.0. Plan is to obtain CT scan to rule out appendicitis, patient also has history of ovarian cysts, will obtain pelvic ultrasound with Doppler flow.  Case signed out to NP Manus Rudd at shift change: Plan is to followup imaging and wet prep.       Wynetta Emery, PA-C 10/11/13 [phone]

## 2013-10-11 NOTE — ED Provider Notes (Signed)
Medical screening examination/treatment/procedure(s) were performed by non-physician practitioner and as supervising physician I was immediately available for consultation/collaboration.   EKG Interpretation None        Tomasita CrumbleAdeleke Aiyah Scarpelli, MD 10/11/13 1849

## 2013-10-11 NOTE — Discharge Instructions (Signed)
Abdominal Pain, Women °Abdominal (stomach, pelvic, or belly) pain can be caused by many things. It is important to tell your doctor: °· The location of the pain. °· Does it come and go or is it present all the time? °· Are there things that start the pain (eating certain foods, exercise)? °· Are there other symptoms associated with the pain (fever, nausea, vomiting, diarrhea)? °All of this is helpful to know when trying to find the cause of the pain. °CAUSES  °· Stomach: virus or bacteria infection, or ulcer. °· Intestine: appendicitis (inflamed appendix), regional ileitis (Crohn's disease), ulcerative colitis (inflamed colon), irritable bowel syndrome, diverticulitis (inflamed diverticulum of the colon), or cancer of the stomach or intestine. °· Gallbladder disease or stones in the gallbladder. °· Kidney disease, kidney stones, or infection. °· Pancreas infection or cancer. °· Fibromyalgia (pain disorder). °· Diseases of the female organs: °¨ Uterus: fibroid (non-cancerous) tumors or infection. °¨ Fallopian tubes: infection or tubal pregnancy. °¨ Ovary: cysts or tumors. °¨ Pelvic adhesions (scar tissue). °¨ Endometriosis (uterus lining tissue growing in the pelvis and on the pelvic organs). °¨ Pelvic congestion syndrome (female organs filling up with blood just before the menstrual period). °¨ Pain with the menstrual period. °¨ Pain with ovulation (producing an egg). °¨ Pain with an IUD (intrauterine device, birth control) in the uterus. °¨ Cancer of the female organs. °· Functional pain (pain not caused by a disease, may improve without treatment). °· Psychological pain. °· Depression. °DIAGNOSIS  °Your doctor will decide the seriousness of your pain by doing an examination. °· Blood tests. °· X-rays. °· Ultrasound. °· CT scan (computed tomography, special type of X-ray). °· MRI (magnetic resonance imaging). °· Cultures, for infection. °· Barium enema (dye inserted in the large intestine, to better view it with  X-rays). °· Colonoscopy (looking in intestine with a lighted tube). °· Laparoscopy (minor surgery, looking in abdomen with a lighted tube). °· Major abdominal exploratory surgery (looking in abdomen with a large incision). °TREATMENT  °The treatment will depend on the cause of the pain.  °· Many cases can be observed and treated at home. °· Over-the-counter medicines recommended by your caregiver. °· Prescription medicine. °· Antibiotics, for infection. °· Birth control pills, for painful periods or for ovulation pain. °· Hormone treatment, for endometriosis. °· Nerve blocking injections. °· Physical therapy. °· Antidepressants. °· Counseling with a psychologist or psychiatrist. °· Minor or major surgery. °HOME CARE INSTRUCTIONS  °· Do not take laxatives, unless directed by your caregiver. °· Take over-the-counter pain medicine only if ordered by your caregiver. Do not take aspirin because it can cause an upset stomach or bleeding. °· Try a clear liquid diet (broth or water) as ordered by your caregiver. Slowly move to a bland diet, as tolerated, if the pain is related to the stomach or intestine. °· Have a thermometer and take your temperature several times a day, and record it. °· Bed rest and sleep, if it helps the pain. °· Avoid sexual intercourse, if it causes pain. °· Avoid stressful situations. °· Keep your follow-up appointments and tests, as your caregiver orders. °· If the pain does not go away with medicine or surgery, you may try: °¨ Acupuncture. °¨ Relaxation exercises (yoga, meditation). °¨ Group therapy. °¨ Counseling. °SEEK MEDICAL CARE IF:  °· You notice certain foods cause stomach pain. °· Your home care treatment is not helping your pain. °· You need stronger pain medicine. °· You want your IUD removed. °· You feel faint or   lightheaded.  You develop nausea and vomiting.  You develop a rash.  You are having side effects or an allergy to your medicine. SEEK IMMEDIATE MEDICAL CARE IF:   Your  pain does not go away or gets worse.  You have a fever.  Your pain is felt only in portions of the abdomen. The right side could possibly be appendicitis. The left lower portion of the abdomen could be colitis or diverticulitis.  You are passing blood in your stools (bright red or black tarry stools, with or without vomiting).  You have blood in your urine.  You develop chills, with or without a fever.  You pass out. MAKE SURE YOU:   Understand these instructions.  Will watch your condition.  Will get help right away if you are not doing well or get worse. Document Released: 10/20/2006 Document Revised: 05/09/2013 Document Reviewed: 11/09/2008 [facility] Patient Information 2015 [place], [place]. This information is not intended to replace advice given to you by your health care provider. Make sure you discuss any questions you have with your health care provider. Her CT scan and ultrasound both revealed normal internal organs.  He did not have any ovarian cysts, you do not have any intrauterine pathology, your appendix is normal.  A prescription for Percocet for pain control.  Please keep a written diary of your discomforts, and make an appointment with Dr. Orvan Falconerampbell for further evaluation.  If you have persistent pain

## 2013-10-12 LAB — GC/CHLAMYDIA PROBE AMP
CT Probe RNA: NEGATIVE
GC PROBE AMP APTIMA: NEGATIVE

## 2013-10-14 ENCOUNTER — Ambulatory Visit: Payer: Self-pay | Admitting: Family

## 2013-10-14 ENCOUNTER — Telehealth: Payer: Self-pay | Admitting: Family

## 2013-10-14 ENCOUNTER — Ambulatory Visit (INDEPENDENT_AMBULATORY_CARE_PROVIDER_SITE_OTHER): Payer: BC Managed Care – PPO | Admitting: Family Medicine

## 2013-10-14 ENCOUNTER — Encounter: Payer: Self-pay | Admitting: Family Medicine

## 2013-10-14 VITALS — BP 100/60 | HR 92 | Temp 98.2°F | Wt 116.0 lb

## 2013-10-14 DIAGNOSIS — R1031 Right lower quadrant pain: Secondary | ICD-10-CM

## 2013-10-14 DIAGNOSIS — M545 Low back pain, unspecified: Secondary | ICD-10-CM

## 2013-10-14 MED ORDER — PREDNISONE 10 MG PO TABS
ORAL_TABLET | ORAL | Status: DC
Start: 2013-10-14 — End: 2013-10-17

## 2013-10-14 NOTE — Progress Notes (Signed)
   Subjective:    Patient ID: Gabriella [name], female    DOB: [birth year], 38 y.o.   MRN: [id number]  Abdominal Pain Pertinent negatives include no constipation, fever or vomiting.   Patient seen for ER followup. She was seen there on 10/10/2013. She had relatively acute onset of right lower quadrant abdominal pain that morning. She has some reported chills and nausea no fever. She denied any vomiting, diarrhea, dysuria. She has Mirena IUD. Prior history of ovarian cyst. She had pelvic exam along with CT abdomen pelvis and pelvic ultrasound and these were basically unrevealing. No evidence for ovarian cyst. Appendix appeared normal. She had minimally elevated white count of 11.0 otherwise normal labs. She had other labs with lipase, urine pregnancy, GC and Chlamydia probe which were all normal.  Patient has had some recent low back pain. 2 months ago she had a hard fall on her sacral region. Pain did not start until last week as above. She describes sharp pain which does be worse late in the day and worse with movement. No lower strain numbness or weakness. No urine or stool incontinence. She is using oxycodone with some relief. No prior history of back difficulties.  Past Medical History  Diagnosis Date  . ANXIETY 09/13/2009  . DEPRESSION 09/13/2009  . ALLERGIC RHINITIS 09/13/2009  . S/P right knee arthroscopy    Past Surgical History  Procedure Laterality Date  . Tonsillectomy and adenoidectomy      reports that she has been smoking Cigarettes.  She has a 20 pack-year smoking history. She does not have any smokeless tobacco history on file. Her alcohol and drug histories are not on file. family history is negative for Cancer, Kidney disease, and Diabetes. Allergies  Allergen Reactions  . Sulfa Antibiotics Rash      Review of Systems  Constitutional: Negative for fever, chills, appetite change and unexpected weight change.  Respiratory: Negative for shortness of breath.   Cardiovascular:  Negative for chest pain.  Gastrointestinal: Positive for abdominal pain. Negative for vomiting, constipation, blood in stool and abdominal distention.  Musculoskeletal: Positive for back pain.  Skin: Negative for rash.  Hematological: Negative for adenopathy.       Objective:   Physical Exam  Constitutional: She appears well-developed and well-nourished. No distress.  Cardiovascular: Normal rate and regular rhythm.   Pulmonary/Chest: Effort normal and breath sounds normal. No respiratory distress. She has no wheezes. She has no rales.  Abdominal: Soft. Bowel sounds are normal. She exhibits no distension and no mass. There is no tenderness. There is no rebound and no guarding.  Musculoskeletal:  Full range of motion right hip without difficulty. She has good range of motion back but does have some nonspecific poorly localized right lower lumbar tenderness. Straight leg raise is negative.  Neurological:  Full-strength lower extremities with symmetric reflexes. Normal sensory function          Assessment & Plan:  Recent right lower quadrant abdominal pain. Workup as above unrevealing. Suspect she may have some right lumbar nerve impingement with radiation toward pelvic region. We've recommended brief taper of prednisone. Continue oxycodone as needed for severe pain. Touch base by next week if not improving. May need MRI lumbar spine to further assess if pain persists

## 2013-10-14 NOTE — Telephone Encounter (Signed)
Patient Information:  Caller Name: Raynelle FanningJulie  Phone: [phone]  Patient: Gabriella [name], Gabriella [name]  Gender: Female  DOB: [birth year]  Age: [age] Years  PCP: Adline Mangoampbell, [facility](Family Practice)  Pregnant: No  Office Follow Up:  Does the office need to follow up with this patient?: No  Instructions For The Office: N/A  RN Note:  Patient calling regarding right sided low abdominal pain.  Minimal relief with Oxycodone.  Requesting early AM appointment due to [employer]s school schedule.  Symptoms  Reason For Call & Symptoms: right sided abdominal pain  Reviewed Health History In EMR: Yes  Reviewed Medications In EMR: Yes  Reviewed Allergies In EMR: Yes  Reviewed Surgeries / Procedures: Yes  Date of Onset of Symptoms: 10/10/2013  Treatments Tried: Oxycodone  Treatments Tried Worked: Yes OB / GYN:  LMP: Unknown  Guideline(s) Used:  Abdominal Pain - Female  Disposition Per Guideline:   See Today in Office  Reason For Disposition Reached:   Moderate or mild pain that comes and goes (cramps) lasts > 24 hours  Advice Given:  Call Back If:  You become worse.  Patient Will Follow Care Advice:  YES  Appointment Scheduled:  10/14/2013 09:45:00 Appointment Scheduled Provider:  Evelena PeatBurchette, [facility](Family Practice)

## 2013-10-14 NOTE — Progress Notes (Signed)
Pre visit review using our clinic review tool, if applicable. No additional management support is needed unless otherwise documented below in the visit note. 

## 2013-10-14 NOTE — Patient Instructions (Signed)
Touch base by next week if no better after the prednisone Avoid any heavy lifting or stooping Follow up promptly for any weakness or progressive pain.

## 2013-10-17 ENCOUNTER — Telehealth: Payer: Self-pay | Admitting: Family

## 2013-10-17 ENCOUNTER — Emergency Department (HOSPITAL_COMMUNITY)
Admission: EM | Admit: 2013-10-17 | Discharge: 2013-10-17 | Disposition: A | Discharge status: Home / Self Care | Payer: BC Managed Care – PPO | Attending: Emergency Medicine | Admitting: Emergency Medicine

## 2013-10-17 ENCOUNTER — Encounter (HOSPITAL_COMMUNITY): Payer: Self-pay | Admitting: Emergency Medicine

## 2013-10-17 DIAGNOSIS — Z8709 Personal history of other diseases of the respiratory system: Secondary | ICD-10-CM | POA: Insufficient documentation

## 2013-10-17 DIAGNOSIS — Z79899 Other long term (current) drug therapy: Secondary | ICD-10-CM | POA: Diagnosis not present

## 2013-10-17 DIAGNOSIS — L259 Unspecified contact dermatitis, unspecified cause: Secondary | ICD-10-CM

## 2013-10-17 DIAGNOSIS — Z9889 Other specified postprocedural states: Secondary | ICD-10-CM | POA: Insufficient documentation

## 2013-10-17 DIAGNOSIS — F419 Anxiety disorder, unspecified: Secondary | ICD-10-CM | POA: Diagnosis not present

## 2013-10-17 DIAGNOSIS — F329 Major depressive disorder, single episode, unspecified: Secondary | ICD-10-CM | POA: Diagnosis not present

## 2013-10-17 DIAGNOSIS — R21 Rash and other nonspecific skin eruption: Secondary | ICD-10-CM | POA: Diagnosis present

## 2013-10-17 DIAGNOSIS — Z720 Tobacco use: Secondary | ICD-10-CM | POA: Insufficient documentation

## 2013-10-17 LAB — CBC WITH DIFFERENTIAL/PLATELET
BASOS ABS: 0 10*3/uL (ref 0.0–0.1)
BASOS PCT: 0 % (ref 0–1)
Eosinophils Absolute: 0.2 10*3/uL (ref 0.0–0.7)
Eosinophils Relative: 1 % (ref 0–5)
HEMATOCRIT: 43.3 % (ref 36.0–46.0)
Hemoglobin: 15.2 g/dL — ABNORMAL HIGH (ref 12.0–15.0)
LYMPHS ABS: 3.2 10*3/uL (ref 0.7–4.0)
Lymphocytes Relative: 14 % (ref 12–46)
MCH: 31.8 pg (ref 26.0–34.0)
MCHC: 35.1 g/dL (ref 30.0–36.0)
MCV: 90.6 fL (ref 78.0–100.0)
MONOS PCT: 6 % (ref 3–12)
Monocytes Absolute: 1.4 10*3/uL — ABNORMAL HIGH (ref 0.1–1.0)
Neutro Abs: 18.2 10*3/uL — ABNORMAL HIGH (ref 1.7–7.7)
Neutrophils Relative %: 79 % — ABNORMAL HIGH (ref 43–77)
Platelets: 389 10*3/uL (ref 150–400)
RBC: 4.78 MIL/uL (ref 3.87–5.11)
RDW: 11.9 % (ref 11.5–15.5)
WBC: 23 10*3/uL — AB (ref 4.0–10.5)

## 2013-10-17 LAB — BASIC METABOLIC PANEL
Anion gap: 15 (ref 5–15)
BUN: 9 mg/dL (ref 6–23)
CHLORIDE: 105 meq/L (ref 96–112)
CO2: 22 meq/L (ref 19–32)
CREATININE: 0.67 mg/dL (ref 0.50–1.10)
Calcium: 8.8 mg/dL (ref 8.4–10.5)
GFR calc non Af Amer: >90 mL/min (ref >90)
GLUCOSE: 152 mg/dL — AB (ref 70–99)
Potassium: 3 mEq/L — ABNORMAL LOW (ref 3.7–5.3)
Sodium: 142 mEq/L (ref 137–147)

## 2013-10-17 MED ORDER — EPINEPHRINE 0.3 MG/0.3ML IJ SOAJ
0.3000 mg | Freq: Once | INTRAMUSCULAR | Status: AC
  Administered 2013-10-17: 0.3 mg via INTRAMUSCULAR
  Filled 2013-10-17: qty 0.3

## 2013-10-17 MED ORDER — PREDNISONE 20 MG PO TABS
60.0000 mg | ORAL_TABLET | Freq: Once | ORAL | Status: AC
  Administered 2013-10-17: 60 mg via ORAL
  Filled 2013-10-17: qty 3

## 2013-10-17 MED ORDER — CEPHALEXIN 250 MG PO CAPS
500.0000 mg | ORAL_CAPSULE | Freq: Once | ORAL | Status: AC
  Administered 2013-10-17: 500 mg via ORAL
  Filled 2013-10-17: qty 2

## 2013-10-17 MED ORDER — DIPHENHYDRAMINE HCL 25 MG PO CAPS
50.0000 mg | ORAL_CAPSULE | Freq: Once | ORAL | Status: AC
  Administered 2013-10-17: 50 mg via ORAL
  Filled 2013-10-17: qty 2

## 2013-10-17 MED ORDER — PREDNISONE 20 MG PO TABS
60.0000 mg | ORAL_TABLET | Freq: Once | ORAL | Status: DC
Start: 2013-10-17 — End: 2013-11-21

## 2013-10-17 MED ORDER — CEPHALEXIN 500 MG PO CAPS: 500.0000 mg | ORAL_CAPSULE | Freq: Four times a day (QID) | ORAL | Status: DC

## 2013-10-17 MED ORDER — POTASSIUM CHLORIDE CRYS ER 20 MEQ PO TBCR
40.0000 meq | EXTENDED_RELEASE_TABLET | Freq: Once | ORAL | Status: AC
  Administered 2013-10-17: 40 meq via ORAL
  Filled 2013-10-17: qty 2

## 2013-10-17 MED ORDER — RANITIDINE HCL 150 MG/10ML PO SYRP
150.0000 mg | ORAL_SOLUTION | Freq: Once | ORAL | Status: AC
  Administered 2013-10-17: 150 mg via ORAL
  Filled 2013-10-17: qty 10

## 2013-10-17 MED ORDER — SODIUM CHLORIDE 0.9 % IV BOLUS (SEPSIS)
1000.0000 mL | Freq: Once | INTRAVENOUS | Status: AC
  Administered 2013-10-17: 1000 mL via INTRAVENOUS

## 2013-10-17 MED ORDER — DIPHENHYDRAMINE HCL 50 MG PO CAPS: 50.0000 mg | ORAL_CAPSULE | Freq: Four times a day (QID) | ORAL | Status: DC

## 2013-10-17 NOTE — Telephone Encounter (Signed)
Please advise 

## 2013-10-17 NOTE — ED Provider Notes (Addendum)
CSN: [id number]     Arrival date & time 10/17/13  [id number] History   First MD Initiated Contact with Patient 10/17/13 0241     Chief Complaint  Patient presents with  . Rash     (Consider location/radiation/quality/duration/timing/severity/associated sxs/prior Treatment) HPI Gabriella [name] is a 38 y.o. female with no significant past medical history coming in for a rash. She states this rash began yesterday and she is very sensitive skin. She states she's never had a rash this extensive pallor. It began in her trunk and spread out to her limbs. She has significant pure this but denies any pain. She's had no fevers or diaphoresis. She denies any chest pain or shortness of breath. Patient denies any sick contacts, recent camping trips, or others with a rash in her home. She cannot pinpoint anything that may have set off her reaction. She has no further complaints.    10 Systems reviewed and are negative for acute change except as noted in the HPI.     Past Medical History  Diagnosis Date  . ANXIETY 09/13/2009  . DEPRESSION 09/13/2009  . ALLERGIC RHINITIS 09/13/2009  . S/P right knee arthroscopy    Past Surgical History  Procedure Laterality Date  . Tonsillectomy and adenoidectomy     Family History  Problem Relation Age of Onset  . Cancer Neg Hx     family hx breast ca   . Kidney disease Neg Hx     family hx  . Diabetes Neg Hx     family hx   History  Substance Use Topics  . Smoking status: Current Every Day Smoker -- 1.00 packs/day for 20 years    Types: Cigarettes  . Smokeless tobacco: Current User  . Alcohol Use: No   OB History   Grav Para Term Preterm Abortions TAB SAB Ect Mult Living   1              Review of Systems    Allergies  Sulfa antibiotics  Home Medications   Prior to Admission medications   Medication Sig Start Date End Date Taking? Authorizing Provider  escitalopram (LEXAPRO) 20 MG tablet Take 20 mg by mouth daily.    Historical Provider, MD   lamoTRIgine (LAMICTAL) 100 MG tablet Take 50 mg by mouth 2 (two) times daily.    Historical Provider, MD  LORazepam (ATIVAN) 0.5 MG tablet Take 0.5 mg by mouth 2 (two) times daily as needed for anxiety.    Historical Provider, MD  MULTIPLE VITAMIN PO Take 1 tablet by mouth daily.    Historical Provider, MD  oxyCODONE-acetaminophen (PERCOCET/ROXICET) 5-325 MG per tablet Take 1 tablet by mouth every 6 (six) hours as needed for severe pain. 10/11/13   Arman FilterGail K Schulz, NP  predniSONE (DELTASONE) 10 MG tablet Taper as follows: 4-4-4-3-3-2-2-1-1 10/14/13   Kristian CoveyBruce W Burchette, MD  pseudoephedrine (SUDAFED) 120 MG 12 hr tablet Take 120 mg by mouth every 12 (twelve) hours as needed for congestion.    Historical Provider, MD   BP 116/76  Pulse 95  Temp(Src) 97.7 F (36.5 C) (Oral)  Resp 20  SpO2 97% Physical Exam  Nursing note and vitals reviewed. Constitutional: She is oriented to person, place, and time. She appears well-developed and well-nourished. No distress.  HENT:  Head: Normocephalic and atraumatic.  Nose: Nose normal.  Mouth/Throat: Oropharynx is clear and moist. No oropharyngeal exudate.  Eyes: Conjunctivae and EOM are normal. Pupils are equal, round, and reactive to light. No scleral icterus.  Neck: Normal range of motion. Neck supple. No JVD present. No tracheal deviation present. No thyromegaly present.  Cardiovascular: Normal rate, regular rhythm and normal heart sounds.  Exam reveals no gallop and no friction rub.   No murmur heard. Pulmonary/Chest: Effort normal and breath sounds normal. No respiratory distress. She has no wheezes. She exhibits no tenderness.  Abdominal: Soft. Bowel sounds are normal. She exhibits no distension and no mass. There is no tenderness. There is no rebound and no guarding.  Musculoskeletal: Normal range of motion. She exhibits no edema and no tenderness.  Lymphadenopathy:    She has no cervical adenopathy.  Neurological: She is alert and oriented to  person, place, and time.  Skin: Skin is warm and dry. Rash noted. She is not diaphoretic. No erythema. No pallor.  Diffuse erythematous rash noted to the entire trunk, back, upper thighs, and upper extremities. No tenderness to palpation.    ED Course  Procedures (including critical care time) Labs Review Labs Reviewed  CBC WITH DIFFERENTIAL - Abnormal; Notable for the following:    WBC 23.0 (*)    Hemoglobin 15.2 (*)    Neutrophils Relative % 79 (*)    Neutro Abs 18.2 (*)    Monocytes Absolute 1.4 (*)    All other components within normal limits  BASIC METABOLIC PANEL - Abnormal; Notable for the following:    Potassium 3.0 (*)    Glucose, Bld 152 (*)    All other components within normal limits    Imaging Review No results found.   EKG Interpretation None      MDM   Final diagnoses:  None    Patient presents emergency department out of concern for her rash. She is unable to give a history of any allergic reaction that may have occurred. She was given a Benadryl, prednisone, ranitidine for treatment. Her symptoms did improve however she still has some pruritus. She was given epinephrine IM.  Upon my Repeat examination, patient's rash was much improved. She states the pruritus persisted but has improved as well.  Patient continues to appear well and in no acute distress. She did have a potassium of 3 which was replaced. Her white count was 23, which I attribute to the epinephrine given. She appears very well, in no acute distress and not septic.  She is not diaphoretic of tachypneic.  I had shared decision making with this patient who is aware of her WBC.  She will take antibiotics and allergic meds and follow up with PCP.  Strict return precautions given.  She demonstrates understanding.   Her vital signs remain within her normal limits after 1 L of fluid. She was given Keflex emergency department for possible cellulitis. She was discharged with instructions to continue  prednisone Benadryl and Keflex and follow up with her primary care physician within 3 days. Return precautions given.   Tomasita CrumbleAdeleke Meegan Shanafelt, MD 10/17/13 [id number]  Tomasita CrumbleAdeleke Chick Cousins, MD 10/17/13 1743

## 2013-10-17 NOTE — Telephone Encounter (Signed)
Pt seen last night in ed for a head to toe rash.   Pt treated and released. They could not find anything causing this. Pt wants to know if you think the lamoTRIgine (LAMICTAL) 100 MG tablet coould be causing this. Do you want me to schedule a week out or see sooner? Pt wants to know if she should stop the lamoTRIgine (LAMICTAL) 100 MG tablet until she comes in? pls advise. Pt states it itches very badly.

## 2013-10-17 NOTE — ED Notes (Signed)
Pt c/o of rash on her whole body, itchiness all over. Pt states this started yesterday morning, denies SOB, NAD noticed.

## 2013-10-17 NOTE — Telephone Encounter (Signed)
emmi emailed °

## 2013-10-17 NOTE — Telephone Encounter (Signed)
I do not think its the Lamictal. But stop Lamictal and put her in an same day slot for this week for a recheck.

## 2013-10-17 NOTE — Discharge Instructions (Signed)
Contact Dermatitis Gabriella [name], you were seen today for a rash. You're treated with medication for allergy and also antibiotics. Continue to take his medicines as prescribed. Continue to take Benadryl and steroids for the next 5 days, and antibiotics for the next week. Followup with her regular Dr. within 3 days for continued treatment. If any of your symptoms worsen including fever, pain, worsening rash, come back to the emergency department immediately for repeat evaluation. Thank you. Contact dermatitis is a rash that happens when something touches the skin. You touched something that irritates your skin, or you have allergies to something you touched. HOME CARE   Avoid the thing that caused your rash.  Keep your rash away from hot water, soap, sunlight, chemicals, and other things that might bother it.  Do not scratch your rash.  You can take cool baths to help stop itching.  Only take medicine as told by your doctor.  Keep all doctor visits as told. GET HELP RIGHT AWAY IF:   Your rash is not better after 3 days.  Your rash gets worse.  Your rash is puffy (swollen), tender, red, sore, or warm.  You have problems with your medicine. MAKE SURE YOU:   Understand these instructions.  Will watch your condition.  Will get help right away if you are not doing well or get worse. Document Released: 10/20/2008 Document Revised: 03/17/2011 Document Reviewed: 05/28/2010 [facility] Patient Information 2015 [place] [place]. This information is not intended to replace advice given to you by your health care provider. Make sure you discuss any questions you have with your health care provider. Cellulitis Cellulitis is an infection of the skin and the tissue under the skin. The infected area is usually red and tender. This happens most often in the arms and lower legs. HOME CARE   Take your antibiotic medicine as told. Finish the medicine even if you start to feel better.  Keep the infected arm  or leg raised (elevated).  Put a warm cloth on the area up to 4 times per day.  Only take medicines as told by your doctor.  Keep all doctor visits as told. GET HELP IF:  You see red streaks on the skin coming from the infected area.  Your red area gets bigger or turns a dark color.  Your bone or joint under the infected area is painful after the skin heals.  Your infection comes back in the same area or different area.  You have a puffy (swollen) bump in the infected area.  You have new symptoms.  You have a fever. GET HELP RIGHT AWAY IF:   You feel very sleepy.  You throw up (vomit) or have watery poop (diarrhea).  You feel sick and have muscle aches and pains. MAKE SURE YOU:   Understand these instructions.  Will watch your condition.  Will get help right away if you are not doing well or get worse. Document Released: 06/11/2007 Document Revised: 05/09/2013 Document Reviewed: 03/10/2011 [facility] Patient Information 2015 [place], [place]. This information is not intended to replace advice given to you by your health care provider. Make sure you discuss any questions you have with your health care provider.

## 2013-10-18 NOTE — Telephone Encounter (Signed)
Pt aware and appt made.

## 2013-10-19 ENCOUNTER — Ambulatory Visit (INDEPENDENT_AMBULATORY_CARE_PROVIDER_SITE_OTHER): Payer: BC Managed Care – PPO | Admitting: Family

## 2013-10-19 ENCOUNTER — Encounter: Payer: Self-pay | Admitting: Family

## 2013-10-19 DIAGNOSIS — L259 Unspecified contact dermatitis, unspecified cause: Secondary | ICD-10-CM

## 2013-10-19 DIAGNOSIS — L299 Pruritus, unspecified: Secondary | ICD-10-CM

## 2013-10-19 LAB — CBC WITH DIFFERENTIAL/PLATELET
BASOS PCT: 0.7 % (ref 0.0–3.0)
Basophils Absolute: 0.1 10*3/uL (ref 0.0–0.1)
Eosinophils Absolute: 0.2 10*3/uL (ref 0.0–0.7)
Eosinophils Relative: 1.3 % (ref 0.0–5.0)
HEMATOCRIT: 43.5 % (ref 36.0–46.0)
HEMOGLOBIN: 14.5 g/dL (ref 12.0–15.0)
Lymphocytes Relative: 13.1 % (ref 12.0–46.0)
Lymphs Abs: 2.2 10*3/uL (ref 0.7–4.0)
MCHC: 33.4 g/dL (ref 30.0–36.0)
MCV: 92.9 fl (ref 78.0–100.0)
MONO ABS: 0.7 10*3/uL (ref 0.1–1.0)
Monocytes Relative: 4.1 % (ref 3.0–12.0)
NEUTROS ABS: 13.8 10*3/uL — AB (ref 1.4–7.7)
Neutrophils Relative %: 80.8 % — ABNORMAL HIGH (ref 43.0–77.0)
Platelets: 438 10*3/uL — ABNORMAL HIGH (ref 150.0–400.0)
RBC: 4.68 Mil/uL (ref 3.87–5.11)
RDW: 12.9 % (ref 11.5–15.5)
WBC: 17.1 10*3/uL — AB (ref 4.0–10.5)

## 2013-10-19 MED ORDER — FLUCONAZOLE 150 MG PO TABS: 150.0000 mg | ORAL_TABLET | Freq: Once | ORAL | Status: DC

## 2013-10-19 MED ORDER — HYDROXYZINE HCL 25 MG PO TABS: 25.0000 mg | ORAL_TABLET | Freq: Three times a day (TID) | ORAL | Status: DC | PRN

## 2013-10-19 NOTE — Patient Instructions (Signed)

## 2013-10-19 NOTE — Progress Notes (Signed)
Subjective:    Patient ID: Gabriella [name], female    DOB: [birth year], 38 y.o.   MRN: [id number]  Rash   38 year old white female, is in today as a ED follow-up 10/17/2013 after being seen for contact dermatitis. She was also noted to have an elevated white blood cell count 23,000. She is currently on cephalexin and prednisone. Rash is improving. Believes that it may have been linked to Lamictal as she's been taking for several months. Denies any changes in detergents, soaps, lotions. No dietary changes. No one in the home with a rash.   Review of Systems  Constitutional: Negative.   HENT: Negative.   Respiratory: Negative.   Cardiovascular: Negative.   Gastrointestinal: Negative.   Endocrine: Negative.   Skin: Positive for rash.  Allergic/Immunologic: Negative.   Neurological: Negative.   Psychiatric/Behavioral: Negative.    Past Medical History  Diagnosis Date  . ANXIETY 09/13/2009  . DEPRESSION 09/13/2009  . ALLERGIC RHINITIS 09/13/2009  . S/P right knee arthroscopy     History   Social History  . Marital Status: Married    Spouse Name: N/A    Number of Children: N/A  . Years of Education: N/A   Occupational History  . Not on file.   Social History Main Topics  . Smoking status: Current Every Day Smoker -- 1.00 packs/day for 20 years    Types: Cigarettes  . Smokeless tobacco: Current User  . Alcohol Use: No  . Drug Use: No  . Sexual Activity: Not on file   Other Topics Concern  . Not on file   Social History Narrative  . No narrative on file    Past Surgical History  Procedure Laterality Date  . Tonsillectomy and adenoidectomy      Family History  Problem Relation Age of Onset  . Cancer Neg Hx     family hx breast ca   . Kidney disease Neg Hx     family hx  . Diabetes Neg Hx     family hx    Allergies  Allergen Reactions  . Sulfa Antibiotics Rash    Current Outpatient Prescriptions on File Prior to Visit  Medication Sig Dispense Refill  .  cephALEXin (KEFLEX) 500 MG capsule Take 1 capsule (500 mg total) by mouth 4 (four) times daily.  20 capsule  0  . diphenhydrAMINE (BENADRYL) 50 MG capsule Take 1 capsule (50 mg total) by mouth every 6 (six) hours.  20 capsule  0  . escitalopram (LEXAPRO) 20 MG tablet Take 20 mg by mouth daily.      Marland Kitchen. LORazepam (ATIVAN) 0.5 MG tablet Take 0.5 mg by mouth 2 (two) times daily as needed for anxiety.      . Multiple Vitamin (MULTIVITAMIN WITH MINERALS) TABS tablet Take 1 tablet by mouth daily.      . predniSONE (DELTASONE) 20 MG tablet Take 3 tablets (60 mg total) by mouth once.  12 tablet  0  . predniSONE (STERAPRED UNI-PAK) 10 MG tablet Take 10-40 tablets by mouth See admin instructions. Tapering dose      . pseudoephedrine (SUDAFED) 120 MG 12 hr tablet Take 120 mg by mouth every 12 (twelve) hours as needed for congestion.       No current facility-administered medications on file prior to visit.    There were no vitals taken for this visit.chart    Objective:   Physical Exam  Constitutional: She is oriented to person, place, and time. She appears well-developed  and well-nourished.  HENT:  Right Ear: External ear normal.  Left Ear: External ear normal.  Nose: Nose normal.  Mouth/Throat: Oropharynx is clear and moist.  Neck: Normal range of motion. Neck supple.  Cardiovascular: Normal rate, regular rhythm and normal heart sounds.   Pulmonary/Chest: Effort normal and breath sounds normal.  Abdominal: Soft. Bowel sounds are normal.  Musculoskeletal: Normal range of motion.  Neurological: She is alert and oriented to person, place, and time.  Skin: Skin is warm and dry. Rash noted.  Improving rash to the torso, arms, bilaterally.   Psychiatric: She has a normal mood and affect.          Assessment & Plan:  Raynelle FanningJulie was seen today for rash.  Diagnoses and associated orders for this visit:  Contact dermatitis - CBC with Differential  Pruritic dermatitis  Other Orders - hydrOXYzine  (ATARAX/VISTARIL) 25 MG tablet; Take 1 tablet (25 mg total) by mouth 3 (three) times daily as needed. - fluconazole (DIFLUCAN) 150 MG tablet; Take 1 tablet (150 mg total) by mouth once.    Requesting prescription for Diflucan and she is on cephalexin that typically causes vaginal candida.

## 2013-10-19 NOTE — Progress Notes (Signed)
Pre visit review using our clinic review tool, if applicable. No additional management support is needed unless otherwise documented below in the visit note. 

## 2013-10-27 ENCOUNTER — Other Ambulatory Visit: Payer: Self-pay | Admitting: Family

## 2013-11-07 ENCOUNTER — Encounter: Payer: Self-pay | Admitting: Family

## 2013-11-21 ENCOUNTER — Ambulatory Visit (INDEPENDENT_AMBULATORY_CARE_PROVIDER_SITE_OTHER): Payer: BC Managed Care – PPO | Admitting: Family Medicine

## 2013-11-21 ENCOUNTER — Encounter: Payer: Self-pay | Admitting: Family Medicine

## 2013-11-21 VITALS — BP 110/80 | HR 92 | Temp 98.6°F | Wt 114.0 lb

## 2013-11-21 DIAGNOSIS — F411 Generalized anxiety disorder: Secondary | ICD-10-CM

## 2013-11-21 DIAGNOSIS — F32A Depression, unspecified: Secondary | ICD-10-CM

## 2013-11-21 DIAGNOSIS — F329 Major depressive disorder, single episode, unspecified: Secondary | ICD-10-CM

## 2013-11-21 NOTE — Progress Notes (Signed)
   Subjective:    Patient ID: Gabriella [name], female    DOB: 02/13/1975, 38 y.o.   MRN: [id number]  HPI Patient seen with chief complaint of anxiety. She has a long history of anxiety and depression. She is currently on Lexapro. She saw behavioral medicine specialist several months ago and there was concern for possible bipolar and she was placed on Lamictal. She states she was doing well with combination of Lamictal and Lexapro but developed extensive body rash and discontinued Lamictal. she discontinued Lamictal around mid October. Since that time she's had excessive worry and anxiety and very poor sleep. She has periods where she feels very irritable and occasionally agitated. Occasional impulsive spending. She apparently had positive mood disorder questionnaire several months ago. She denies any history of delusions.  She remains on Lexapro 20 mg daily. Denies any suicidal ideation. She does not have any concerns regarding imminent risk of harming herself or others. She apparently has never seen a psychiatrist to help pin down her diagnosis of whether she really has bipolar.  Past Medical History  Diagnosis Date  . ANXIETY 09/13/2009  . DEPRESSION 09/13/2009  . ALLERGIC RHINITIS 09/13/2009  . S/P right knee arthroscopy    Past Surgical History  Procedure Laterality Date  . Tonsillectomy and adenoidectomy      reports that she has been smoking Cigarettes.  She has a 20 pack-year smoking history. She uses smokeless tobacco. She reports that she does not drink alcohol or use illicit drugs. family history is negative for Cancer, Kidney disease, and Diabetes. Allergies  Allergen Reactions  . Sulfa Antibiotics Rash      Review of Systems  Constitutional: Negative for appetite change and unexpected weight change.  Respiratory: Negative for shortness of breath.   Cardiovascular: Negative for chest pain.  Neurological: Negative for dizziness and headaches.  Psychiatric/Behavioral: Positive for  sleep disturbance, dysphoric mood and agitation. Negative for suicidal ideas, hallucinations and self-injury. The patient is nervous/anxious.        Objective:   Physical Exam  Constitutional: She is oriented to person, place, and time. She appears well-developed and well-nourished.  Cardiovascular: Normal rate and regular rhythm.   Pulmonary/Chest: Effort normal and breath sounds normal. No respiratory distress. She has no wheezes. She has no rales.  Neurological: She is alert and oriented to person, place, and time. No cranial nerve deficit.  Psychiatric: She has a normal mood and affect. Judgment and thought content normal.  She is slightly anxious in appearance but very appropriate interactions otherwise          Assessment & Plan:  History of anxiety and depression. There is concern clinically whether she may have bipolar disorder. Possible intolerance to Lamictal. We've recommended psychiatric evaluation to help pin down diagnosis and consider other treatment options

## 2013-11-21 NOTE — Progress Notes (Signed)
Pre visit review using our clinic review tool, if applicable. No additional management support is needed unless otherwise documented below in the visit note. 

## 2014-01-02 ENCOUNTER — Other Ambulatory Visit: Payer: Self-pay | Admitting: Family

## 2014-01-03 ENCOUNTER — Other Ambulatory Visit: Payer: Self-pay | Admitting: *Deleted

## 2014-01-03 MED ORDER — ESCITALOPRAM OXALATE 20 MG PO TABS: 20.0000 mg | ORAL_TABLET | Freq: Every day | ORAL | Status: DC

## 2014-01-25 ENCOUNTER — Ambulatory Visit (INDEPENDENT_AMBULATORY_CARE_PROVIDER_SITE_OTHER): Payer: BC Managed Care – PPO | Admitting: Family

## 2014-01-25 ENCOUNTER — Encounter: Payer: Self-pay | Admitting: Family

## 2014-01-25 VITALS — BP 98/60 | HR 107 | Temp 98.7°F | Wt 113.5 lb

## 2014-01-25 DIAGNOSIS — F329 Major depressive disorder, single episode, unspecified: Secondary | ICD-10-CM

## 2014-01-25 DIAGNOSIS — F32A Depression, unspecified: Secondary | ICD-10-CM

## 2014-01-25 NOTE — Progress Notes (Signed)
Pre visit review using our clinic review tool, if applicable. No additional management support is needed unless otherwise documented below in the visit note. 

## 2014-01-25 NOTE — Progress Notes (Signed)
Subjective:    Patient ID: Gabriella [name], female    DOB: [birth year], 39 y.o.   MRN: [id number]  HPI 39 year old white female, 1 ppd, is in today for a recheck of Depression. She is taking Lexapro and tolerating it well. Was previously on Lamictal with Lexapro that made her feel the best but was discontinued due to an allergic reaction. She has an appointment scheduled with Dr. Lafayette Dragon in September for management of depression. Does not want to make any medication adjustments right now until she sees psychiatrist.   Review of Systems  Constitutional: Negative.   HENT: Negative.   Respiratory: Negative.   Cardiovascular: Negative.   Gastrointestinal: Negative.   Endocrine: Negative.   Genitourinary: Negative.   Musculoskeletal: Negative.   Skin: Negative.   Neurological: Negative.   Hematological: Negative.   Psychiatric/Behavioral: Negative.    Past Medical History  Diagnosis Date  . ANXIETY 09/13/2009  . DEPRESSION 09/13/2009  . ALLERGIC RHINITIS 09/13/2009  . S/P right knee arthroscopy     History   Social History  . Marital Status: Married    Spouse Name: N/A    Number of Children: N/A  . Years of Education: N/A   Occupational History  . Not on file.   Social History Main Topics  . Smoking status: Current Every Day Smoker -- 1.00 packs/day for 20 years    Types: Cigarettes  . Smokeless tobacco: Current User  . Alcohol Use: No  . Drug Use: No  . Sexual Activity: Not on file   Other Topics Concern  . Not on file   Social History Narrative    Past Surgical History  Procedure Laterality Date  . Tonsillectomy and adenoidectomy      Family History  Problem Relation Age of Onset  . Cancer Neg Hx     family hx breast ca   . Kidney disease Neg Hx     family hx  . Diabetes Neg Hx     family hx    Allergies  Allergen Reactions  . Sulfa Antibiotics Rash    Current Outpatient Prescriptions on File Prior to Visit  Medication Sig Dispense Refill  . escitalopram  (LEXAPRO) 20 MG tablet Take 1 tablet (20 mg total) by mouth daily. 30 tablet 5  . LORazepam (ATIVAN) 0.5 MG tablet TAKE 1 TABLET BY MOUTH TWICE DAILY AS NEEDED FOR ANXIETY 60 tablet 1  . Multiple Vitamin (MULTIVITAMIN WITH MINERALS) TABS tablet Take 1 tablet by mouth daily.     No current facility-administered medications on file prior to visit.    BP 98/60 mmHg  Pulse 107  Temp(Src) 98.7 F (37.1 C) (Oral)  Wt 113 lb 8 oz (51.483 kg)chart    Objective:   Physical Exam  Constitutional: She is oriented to person, place, and time. She appears well-developed and well-nourished.  HENT:  Right Ear: External ear normal.  Left Ear: External ear normal.  Nose: Nose normal.  Mouth/Throat: Oropharynx is clear and moist.  Neck: Normal range of motion. Neck supple.  Cardiovascular: Normal rate, regular rhythm and normal heart sounds.   Pulmonary/Chest: Effort normal and breath sounds normal.  Abdominal: Soft. Bowel sounds are normal.  Musculoskeletal: Normal range of motion.  Neurological: She is alert and oriented to person, place, and time.  Skin: Skin is warm.  Psychiatric: She has a normal mood and affect.          Assessment & Plan:  Gabriella [name] was seen today for medication eval.  Diagnoses and associated orders for this visit:  Depression    Continue current medications. Call the office with any questions or concerns. Recheck as needed.

## 2014-01-25 NOTE — Patient Instructions (Signed)
Insomnia Insomnia is frequent trouble falling and/or staying asleep. Insomnia can be a long term problem or a short term problem. Both are common. Insomnia can be a short term problem when the wakefulness is related to a certain stress or worry. Long term insomnia is often related to ongoing stress during waking hours and/or poor sleeping habits. Overtime, sleep deprivation itself can make the problem worse. Every little thing feels more severe because you are overtired and your ability to cope is decreased. CAUSES   Stress, anxiety, and depression.  Poor sleeping habits.  Distractions such as TV in the bedroom.  Naps close to bedtime.  Engaging in emotionally charged conversations before bed.  Technical reading before sleep.  Alcohol and other sedatives. They may make the problem worse. They can hurt normal sleep patterns and normal dream activity.  Stimulants such as caffeine for several hours prior to bedtime.  Pain syndromes and shortness of breath can cause insomnia.  Exercise late at night.  Changing time zones may cause sleeping problems (jet lag). It is sometimes helpful to have someone observe your sleeping patterns. They should look for periods of not breathing during the night (sleep apnea). They should also look to see how long those periods last. If you live alone or observers are uncertain, you can also be observed at a sleep clinic where your sleep patterns will be professionally monitored. Sleep apnea requires a checkup and treatment. Give your caregivers your medical history. Give your caregivers observations your family has made about your sleep.  SYMPTOMS   Not feeling rested in the morning.  Anxiety and restlessness at bedtime.  Difficulty falling and staying asleep. TREATMENT   Your caregiver may prescribe treatment for an underlying medical disorders. Your caregiver can give advice or help if you are using alcohol or other drugs for self-medication. Treatment  of underlying problems will usually eliminate insomnia problems.  Medications can be prescribed for short time use. They are generally not recommended for lengthy use.  Over-the-counter sleep medicines are not recommended for lengthy use. They can be habit forming.  You can promote easier sleeping by making lifestyle changes such as:  Using relaxation techniques that help with breathing and reduce muscle tension.  Exercising earlier in the day.  Changing your diet and the time of your last meal. No night time snacks.  Establish a regular time to go to bed.  Counseling can help with stressful problems and worry.  Soothing music and white noise may be helpful if there are background noises you cannot remove.  Stop tedious detailed work at least one hour before bedtime. HOME CARE INSTRUCTIONS   Keep a diary. Inform your caregiver about your progress. This includes any medication side effects. See your caregiver regularly. Take note of:  Times when you are asleep.  Times when you are awake during the night.  The quality of your sleep.  How you feel the next day. This information will help your caregiver care for you.  Get out of bed if you are still awake after 15 minutes. Read or do some quiet activity. Keep the lights down. Wait until you feel sleepy and go back to bed.  Keep regular sleeping and waking hours. Avoid naps.  Exercise regularly.  Avoid distractions at bedtime. Distractions include watching television or engaging in any intense or detailed activity like attempting to balance the household checkbook.  Develop a bedtime ritual. Keep a familiar routine of bathing, brushing your teeth, climbing into bed at the same   time each night, listening to soothing music. Routines increase the success of falling to sleep faster.  Use relaxation techniques. This can be using breathing and muscle tension release routines. It can also include visualizing peaceful scenes. You can  also help control troubling or intruding thoughts by keeping your mind occupied with boring or repetitive thoughts like the old concept of counting sheep. You can make it more creative like imagining planting one beautiful flower after another in your backyard garden.  During your day, work to eliminate stress. When this is not possible use some of the previous suggestions to help reduce the anxiety that accompanies stressful situations. MAKE SURE YOU:   Understand these instructions.  Will watch your condition.  Will get help right away if you are not doing well or get worse. Document Released: 12/21/1999 Document Revised: 03/17/2011 Document Reviewed: 01/20/2007 [employer] Patient Information 2015 [employer]. This information is not intended to replace advice given to you by your health care provider. Make sure you discuss any questions you have with your health care provider.  

## 2014-02-22 ENCOUNTER — Ambulatory Visit: Payer: BC Managed Care – PPO | Admitting: Family

## 2014-04-28 ENCOUNTER — Encounter: Payer: Self-pay | Admitting: Family Medicine

## 2014-04-28 ENCOUNTER — Ambulatory Visit (INDEPENDENT_AMBULATORY_CARE_PROVIDER_SITE_OTHER): Payer: BC Managed Care – PPO | Admitting: Family Medicine

## 2014-04-28 VITALS — BP 102/68 | Temp 97.4°F | Ht 63.5 in | Wt 113.4 lb

## 2014-04-28 DIAGNOSIS — Z7189 Other specified counseling: Secondary | ICD-10-CM | POA: Diagnosis not present

## 2014-04-28 DIAGNOSIS — Z23 Encounter for immunization: Secondary | ICD-10-CM | POA: Diagnosis not present

## 2014-04-28 DIAGNOSIS — Z720 Tobacco use: Secondary | ICD-10-CM

## 2014-04-28 DIAGNOSIS — J309 Allergic rhinitis, unspecified: Secondary | ICD-10-CM

## 2014-04-28 DIAGNOSIS — Z7689 Persons encountering health services in other specified circumstances: Secondary | ICD-10-CM

## 2014-04-28 DIAGNOSIS — F39 Unspecified mood [affective] disorder: Secondary | ICD-10-CM

## 2014-04-28 MED ORDER — ESCITALOPRAM OXALATE 20 MG PO TABS: 20.0000 mg | ORAL_TABLET | Freq: Every day | ORAL | Status: DC

## 2014-04-28 NOTE — Patient Instructions (Addendum)
BEFORE YOU LEAVE: -Tdap -set up physical in 3-6 months, come fasting - can get your pap done her or with your gynecologist  SMOKING: -go down by two cigarettes per week -then when down to 5 per day - use gum or lozenge instead of the cigarette  I sent a refill for the lexapro to get to your appointment with Dr. Evelene CroonKaur. I do not prescribe ativan. I advise regular exercise and counseling to help as well.  Try 1/2 tablet zyrtec before bed Continue flonase

## 2014-04-28 NOTE — Progress Notes (Signed)
Pre visit review using our clinic review tool, if applicable. No additional management support is needed unless otherwise documented below in the visit note. 

## 2014-04-28 NOTE — Progress Notes (Signed)
HPI:  Gabriella [name] is here to establish care. Used to see Adline MangoPadonda Campbell. Last PCP and physical: sees gyn for paps and female exams.  Has the following chronic problems that require follow up and concerns today:  GAD and Depression: -plans to see Dr. Evelene CroonKaur and scheduled to see her in July -current medications: lexapro 20mg  daily, benzo (ativan prn for panic attacks - uses a few times per week) -denies: hx of hospitalization, SI, worsening -no CBT -no regular CV exercise  URI: -flonase daily -symptoms: nasal symptoms, watery itchy eyes -sees eye doctor for her eye symptoms  Smoker: -1ppd for 25 years -denies:denies cough, wheezing, SOB, hemoptysis, weight, fevers -did not tolerate wellbutrin - made anxiety  ROS negative for unless reported above: fevers, unintentional weight loss, hearing or vision loss, chest pain, palpitations, struggling to breath, hemoptysis, melena, hematochezia, hematuria, falls, loc, si, thoughts of self harm  Past Medical History  Diagnosis Date  . Anxiety and depression 09/13/2009  . ALLERGIC RHINITIS 09/13/2009  . S/P right knee arthroscopy   . Tobacco use     Past Surgical History  Procedure Laterality Date  . Tonsillectomy and adenoidectomy      No family history on file.  History   Social History  . Marital Status: Married    Spouse Name: N/A  . Number of Children: N/A  . Years of Education: N/A   Social History Main Topics  . Smoking status: Current Every Day Smoker -- 1.00 packs/day for 20 years    Types: Cigarettes  . Smokeless tobacco: Current User     Comment: 1ppd since highschool  . Alcohol Use: No  . Drug Use: No  . Sexual Activity: Not on file   Other Topics Concern  . None   Social History Narrative   Work or School: [employer]      Home Situation: lives with 3 children and husband      Spiritual Beliefs: Christian      Lifestyle: no regular exercise, diet is good            Current outpatient  prescriptions:  .  escitalopram (LEXAPRO) 20 MG tablet, Take 1 tablet (20 mg total) by mouth daily., Disp: 30 tablet, Rfl: 2 .  fluticasone (FLONASE) 50 MCG/ACT nasal spray, Place into both nostrils daily., Disp: , Rfl:  .  levonorgestrel (MIRENA) 20 MCG/24HR IUD, 1 each by Intrauterine route once., Disp: , Rfl:  .  LORazepam (ATIVAN) 0.5 MG tablet, TAKE 1 TABLET BY MOUTH TWICE DAILY AS NEEDED FOR ANXIETY, Disp: 60 tablet, Rfl: 1 .  Multiple Vitamin (MULTIVITAMIN WITH MINERALS) TABS tablet, Take 1 tablet by mouth daily., Disp: , Rfl:   EXAM:  Filed Vitals:   04/28/14 1127  BP: 102/68  Temp: 97.4 F (36.3 C)    Body mass index is 19.77 kg/(m^2).  GENERAL: vitals reviewed and listed above, alert, oriented, appears well hydrated and in no acute distress  HEENT: atraumatic, conjunttiva clear, no obvious abnormalities on inspection of external nose and ears  NECK: no obvious masses on inspection  LUNGS: clear to auscultation bilaterally, no wheezes, rales or rhonchi, good air movement  CV: HRRR, no peripheral edema  MS: moves all extremities without noticeable abnormality  PSYCH: pleasant and cooperative, no obvious depression or anxiety  ASSESSMENT AND PLAN:  Discussed the following assessment and plan:  Anxiety and Depression - Establishing with Dr. Evelene CroonKaur in July 2016 -refilled SSRI to get to her appt with her psychiatrist, advised I don't  rx benzos -advised CBT  Tobacco abuse -advised to quit and offered help -counseled  Allergic rhinitis, unspecified allergic rhinitis type -INS, antihistamine, quit smoking  -We reviewed the PMH, PSH, FH, SH, Meds and Allergies. -We provided refills for any medications we will prescribe as needed. -We addressed current concerns per orders and patient instructions. -We have asked for records for pertinent exams, studies, vaccines and notes from previous providers. -We have advised patient to follow up per instructions  below.   -Patient advised to return or notify a doctor immediately if symptoms worsen or persist or new concerns arise.  Patient Instructions  BEFORE YOU LEAVE: -Tdap -set up physical in 3-6 months, come fasting - can get your pap done her or with your gynecologist  SMOKING: -go down by two cigarettes per week -then when down to 5 per day - use gum or lozenge instead of the cigarette  I sent a refill for the lexapro to get to your appointment with Dr. Evelene Croon. I do not prescribe ativan. I advise regular exercise and counseling to help as well.  Try 1/2 tablet zyrtec before bed Continue flonase     Jerel Sardina R.

## 2014-06-19 ENCOUNTER — Other Ambulatory Visit: Payer: Self-pay | Admitting: *Deleted

## 2014-06-19 ENCOUNTER — Encounter: Payer: Self-pay | Admitting: Family Medicine

## 2014-06-19 MED ORDER — MONTELUKAST SODIUM 10 MG PO TABS: 10.0000 mg | ORAL_TABLET | Freq: Every day | ORAL | Status: DC

## 2014-06-19 NOTE — Telephone Encounter (Signed)
Rx sent and the pt was informed via Mychart message.

## 2014-07-17 ENCOUNTER — Ambulatory Visit (INDEPENDENT_AMBULATORY_CARE_PROVIDER_SITE_OTHER): Payer: Self-pay | Admitting: Family Medicine

## 2014-07-17 DIAGNOSIS — R69 Illness, unspecified: Secondary | ICD-10-CM

## 2014-07-17 NOTE — Progress Notes (Signed)
No show

## 2014-07-20 ENCOUNTER — Other Ambulatory Visit: Payer: Self-pay | Admitting: Family Medicine

## 2014-07-25 ENCOUNTER — Encounter: Payer: Self-pay | Admitting: Family Medicine

## 2014-07-25 ENCOUNTER — Ambulatory Visit (INDEPENDENT_AMBULATORY_CARE_PROVIDER_SITE_OTHER): Payer: BC Managed Care – PPO | Admitting: Family Medicine

## 2014-07-25 VITALS — BP 90/60 | HR 103 | Temp 99.3°F | Ht 63.5 in | Wt 112.4 lb

## 2014-07-25 DIAGNOSIS — J309 Allergic rhinitis, unspecified: Secondary | ICD-10-CM

## 2014-07-25 DIAGNOSIS — Z720 Tobacco use: Secondary | ICD-10-CM

## 2014-07-25 DIAGNOSIS — F39 Unspecified mood [affective] disorder: Secondary | ICD-10-CM

## 2014-07-25 NOTE — Patient Instructions (Addendum)
Call and set up your pap smear with your gynecologist - please send us report.  Please stop smoking - use nicotine replacement if needed and call for a quit coach. You can do this!!!!  Call to schedule appointment with the allergist  Follow up as scheduled

## 2014-07-25 NOTE — Progress Notes (Signed)
Pre visit review using our clinic review tool, if applicable. No additional management support is needed unless otherwise documented below in the visit note. 

## 2014-07-25 NOTE — Progress Notes (Signed)
HPI:  GAD and Depression: -seeing Dr. Evelene Croon, reports is doing quite well -current medications: lexapro  daily, latuda  daily recently, benzo (ativan prn for panic attacks - uses a few times per week) -denies: hx of hospitalization, SI, worsening -no CBT -no regular CV exercise  AR: -flonase daily, antihistamine and singulair -symptoms: nasal symptoms, watery itchy eyes -sees eye doctor for her eye symptoms -wants to see allergist  Smoker: -1ppd for 25 years, counseled to quit -denies:denies cough, wheezing, SOB, hemoptysis, weight, fevers -did not tolerate wellbutrin - made anxiety worse -feels like she could try to conquer this now that she is feeling better  HM: pap - she sees gyn for this  ROS negative for unless reported above: fevers, unintentional weight loss, hearing or vision loss, chest pain, palpitations, struggling to breath, hemoptysis, melena, hematochezia, hematuria, falls, loc, si, thoughts of self harm  ROS: See pertinent positives and negatives per HPI.  Past Medical History  Diagnosis Date  . Anxiety and depression 09/13/2009  . ALLERGIC RHINITIS 09/13/2009  . S/P right knee arthroscopy   . Tobacco use     Past Surgical History  Procedure Laterality Date  . Tonsillectomy and adenoidectomy      No family history on file.  History   Social History  . Marital Status: Married    Spouse Name: N/A  . Number of Children: N/A  . Years of Education: N/A   Social History Main Topics  . Smoking status: Current Every Day Smoker -- 1.00 packs/day for 20 years    Types: Cigarettes  . Smokeless tobacco: Current User     Comment: 1ppd since highschool  . Alcohol Use: No  . Drug Use: No  . Sexual Activity: Not on file   Other Topics Concern  . None   Social History Narrative   Work or School: [employer] Situation: lives with 3 children and husband      Spiritual Beliefs: Christian      Lifestyle: no regular exercise, diet is good             Current outpatient prescriptions:  .  escitalopram (LEXAPRO) 20 MG tablet, Take 1 tablet (20 mg total) by mouth daily., Disp: 30 tablet, Rfl: 2 .  fluticasone (FLONASE) 50 MCG/ACT nasal spray, Place into both nostrils daily., Disp: , Rfl:  .  levonorgestrel (MIRENA) 20 MCG/24HR IUD, 1 each by Intrauterine route once., Disp: , Rfl:  .  LORazepam (ATIVAN) 0.5 MG tablet, TAKE 1 TABLET BY MOUTH TWICE DAILY AS NEEDED FOR ANXIETY, Disp: 60 tablet, Rfl: 1 .  Lurasidone HCl (LATUDA) 20 MG TABS, Take by mouth daily., Disp: , Rfl:  .  montelukast (SINGULAIR) 10 MG tablet, TAKE 1 TABLET(10 MG) BY MOUTH AT BEDTIME, Disp: 30 tablet, Rfl: 0 .  Multiple Vitamin (MULTIVITAMIN WITH MINERALS) TABS tablet, Take 1 tablet by mouth daily., Disp: , Rfl:   EXAM:  Filed Vitals:   07/25/14 1345  BP: 90/60  Pulse: 103  Temp: 99.3 F (37.4 C)    Body mass index is 19.6 kg/(m^2).  GENERAL: vitals reviewed and listed above, alert, oriented, appears well hydrated and in no acute distress  HEENT: atraumatic, conjunttiva clear, no obvious abnormalities on inspection of external nose and ears  NECK: no obvious masses on inspection  LUNGS: clear to auscultation bilaterally, no wheezes, rales or rhonchi, good air movement  CV: HRRR, no peripheral edema  MS: moves all extremities without noticeable abnormality  PSYCH: pleasant  and cooperative, no obvious depression or anxiety  ASSESSMENT AND PLAN:  Discussed the following assessment and plan:  Allergic rhinitis, unspecified allergic rhinitis type -advised to see allergist, number provided to call  Tobacco abuse -advised to quit, offered help, counseled for about 5 minutes, she wants to try quit coach and nicotine lozenge/gum  Mood disorder -improved, managed by Dr. Evelene CroonKaur  -Patient advised to return or notify a doctor immediately if symptoms worsen or persist or new concerns arise.  Patient Instructions  Call and set up your pap smear  with your gynecologist - please send us report.  Please stop smoking - use nicotine replacement if needed and call for a quit coach. You can do this!!!!  Call to schedule appointment with the allergist  Follow up as scheduled     Meleane Selinger R.

## 2014-09-07 ENCOUNTER — Encounter: Payer: BC Managed Care – PPO | Admitting: Family Medicine

## 2014-09-07 ENCOUNTER — Other Ambulatory Visit: Payer: Self-pay | Admitting: Family Medicine

## 2015-01-23 ENCOUNTER — Other Ambulatory Visit: Payer: Self-pay | Admitting: Family

## 2015-01-23 NOTE — Telephone Encounter (Signed)
Pt last visit 09/07/14 Pt last Rx refill 09/08/14 #30

## 2015-03-12 ENCOUNTER — Other Ambulatory Visit: Payer: Self-pay | Admitting: Family Medicine

## 2015-07-02 ENCOUNTER — Other Ambulatory Visit: Payer: Self-pay | Admitting: Family Medicine

## 2016-06-15 IMAGING — US US PELVIS COMPLETE
1 series · 13 of 25 positions shown · non-contrast
Comparison: Pelvic ultrasound performed 07/01/2010

CLINICAL DATA: Acute onset of right lower quadrant abdominal pain.
Initial encounter.

EXAM:
TRANSABDOMINAL AND TRANSVAGINAL ULTRASOUND OF PELVIS
DOPPLER ULTRASOUND OF OVARIES
TECHNIQUE: Both transabdominal and transvaginal ultrasound examinations of the
pelvis were performed. Transabdominal technique was performed for
global imaging of the pelvis including uterus, ovaries, adnexal
regions, and pelvic cul-de-sac.
It was necessary to proceed with endovaginal exam following the
transabdominal exam to visualize the ovaries and adnexa. Color and
duplex Doppler ultrasound was utilized to evaluate blood flow to the
ovaries.

[Series 1: us pelvis complete · 0.20mm/px · 13 of 84 slices shown]
[im 1/84]
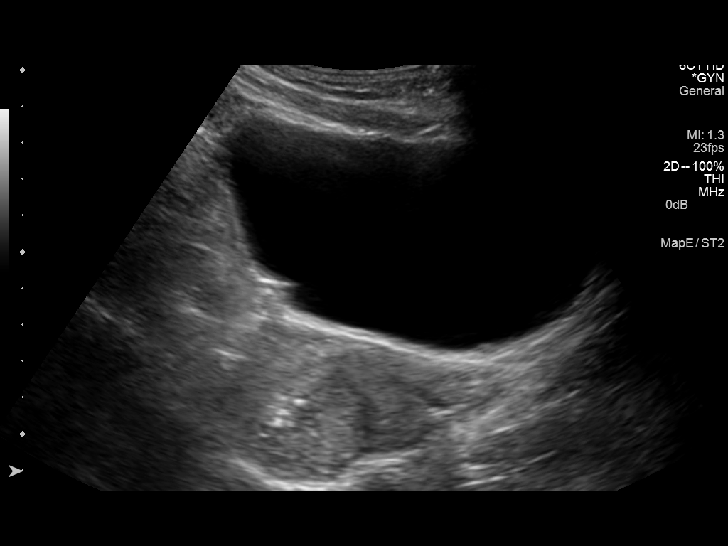
[im 7/84]
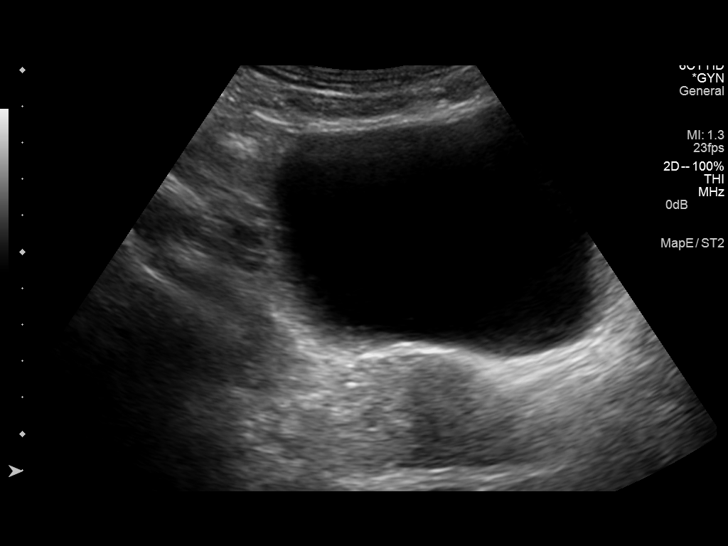
[im 14/84]
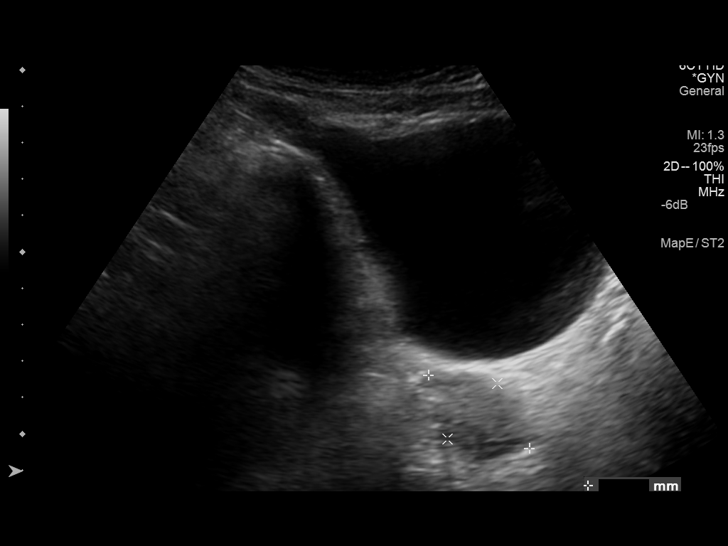
[im 21/84]
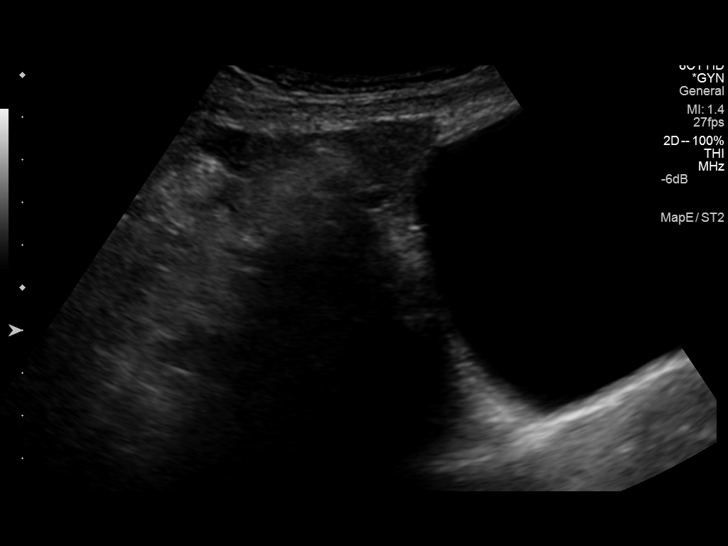
[im 28/84]
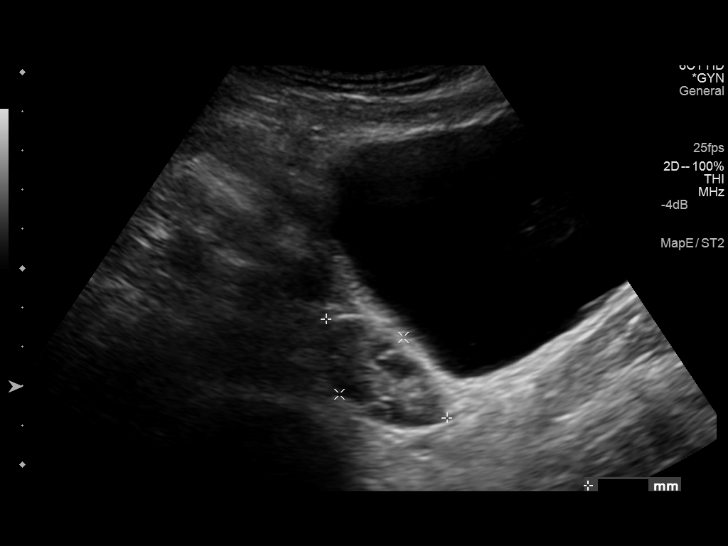
[im 35/84]
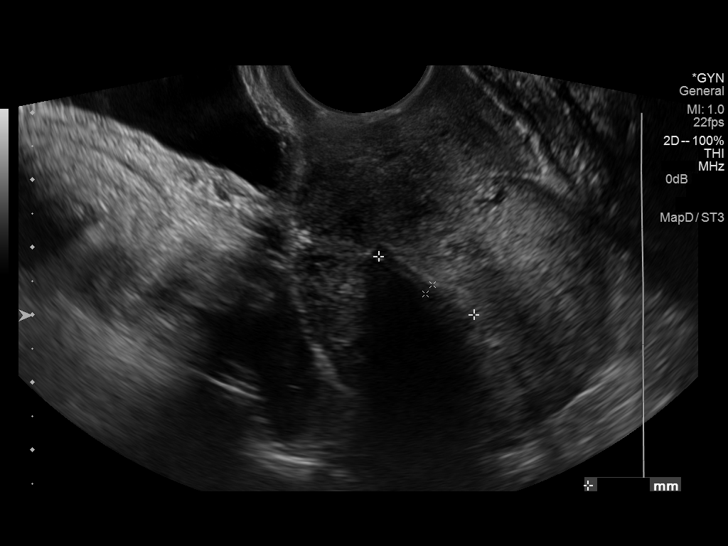
[im 42/84]
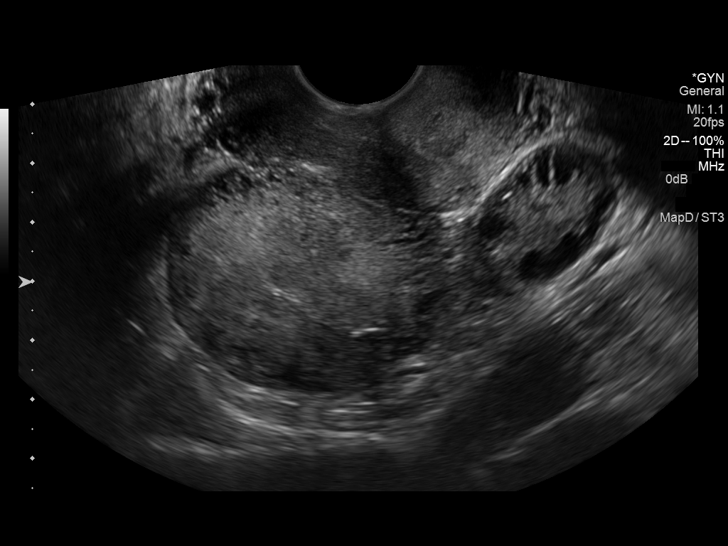
[im 49/84]
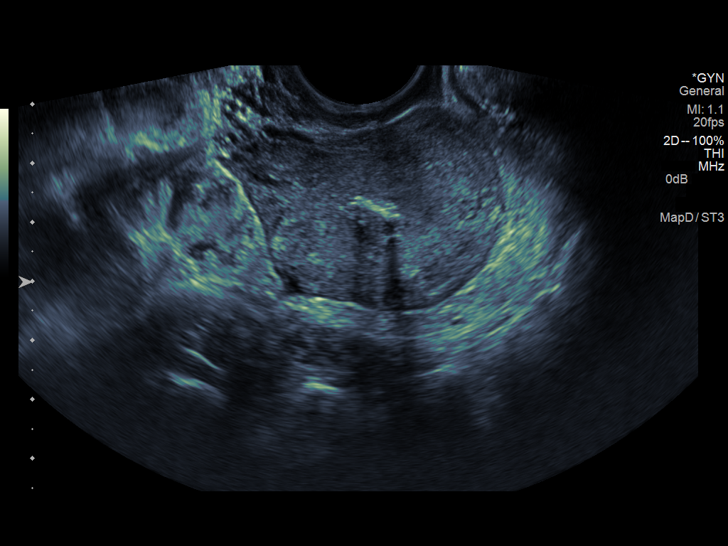
[im 56/84]
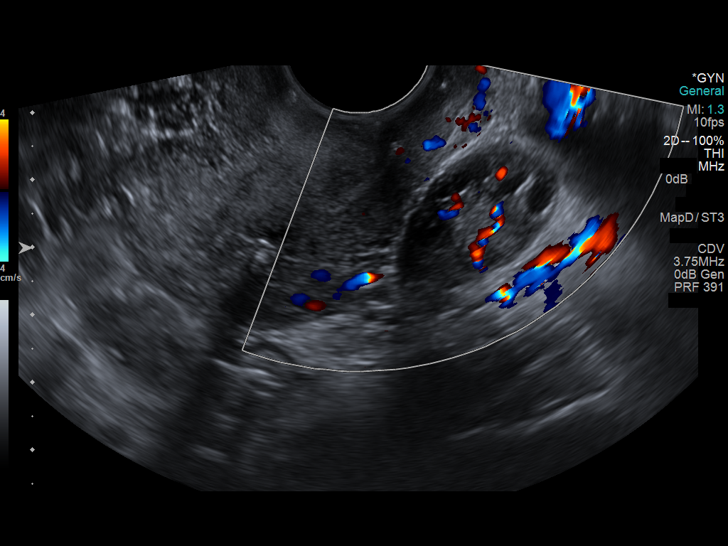
[im 63/84]
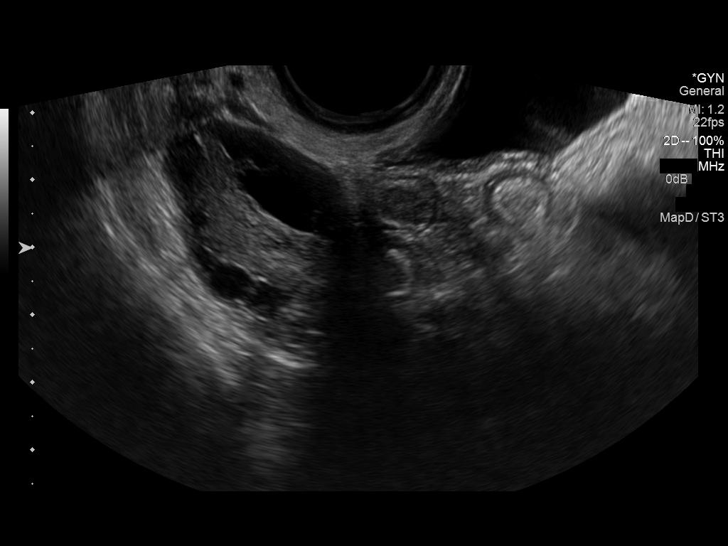
[im 70/84]
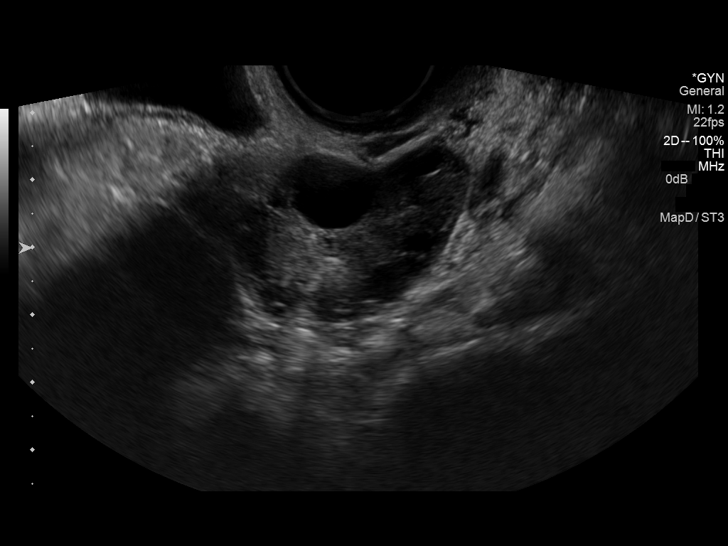
[im 77/84]
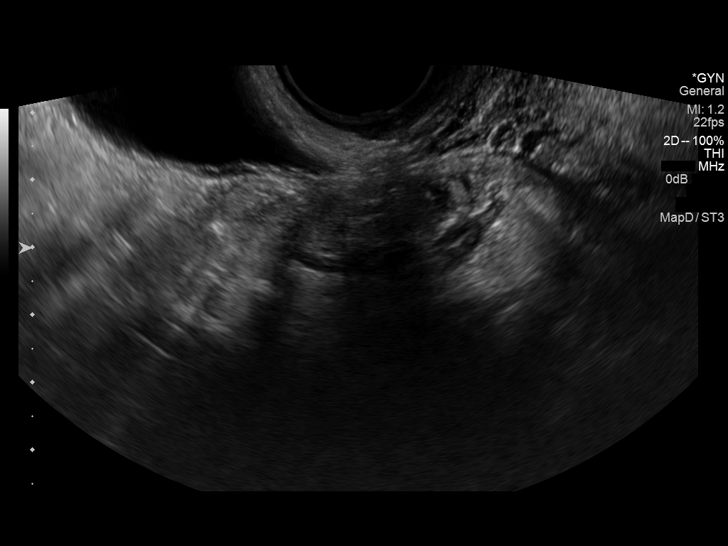
[im 84/84]
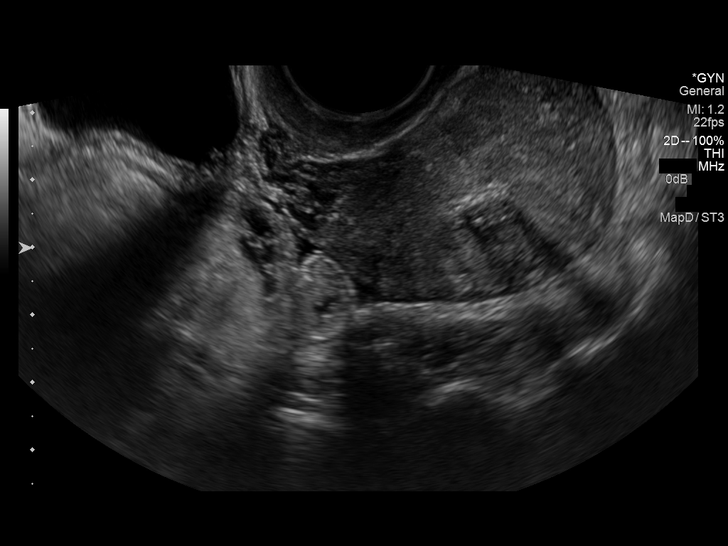

[13 of 25 positions shown; findings below may reference images not displayed]

FINDINGS: Uterus

Measurements: 6.1 x 4.0 x 5.0 cm. Retroflexed in nature. There
appears to be a small 1.1 cm submucosal fibroid at the right
posterior aspect of the uterus, adjacent to the intrauterine device.

Endometrium

Not well characterized due to the patient's intrauterine device. No
focal abnormality visualized.

Right ovary

Measurements: 3.7 x 2.7 x 2.9 cm. Normal appearance/no adnexal mass.

Left ovary

Measurements: 3.8 x 1.9 x 2.9 cm. Normal appearance/no adnexal mass.

Pulsed Doppler evaluation of both ovaries demonstrates normal
low-resistance arterial and venous waveforms.

Other findings

No free fluid is seen within the pelvic cul-de-sac.
IMPRESSION: 1. No evidence of ovarian torsion. The ovaries are unremarkable in
appearance.
2. Retroflexed uterus, with an apparent small 1.1 cm submucosal
fibroid at the right posterior aspect of the uterus, adjacent to the
intrauterine device. Intrauterine device noted in expected position.

## 2016-06-15 IMAGING — CT CT ABD-PELV W/ CM
2 of 4 series · 16 of 46 positions shown, 18 images · IV contrast (Omni 300)
Comparison: Pelvic ultrasound performed 07/01/2010

CLINICAL DATA: Right-sided abdominal pain, acute onset. Chills and
nausea. Initial encounter.

EXAM:
CT ABDOMEN AND PELVIS WITH CONTRAST
TECHNIQUE: Multidetector CT imaging of the abdomen and pelvis was performed
using the standard protocol following bolus administration of
intravenous contrast.
CONTRAST:  80mL OMNIPAQUE IOHEXOL 300 MG/ML  SOLN

[Series 2: abd/ pelvis 5.0 i30f 1 · axial · 0.68mm/px · z∈[+844,+1244]mm · 13 of 88 slices shown, 15 images]
[im 4/88  soft-tissue]
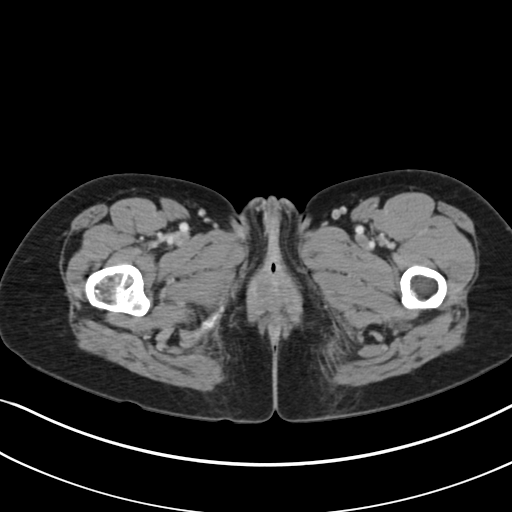
[im 4/88  bone]
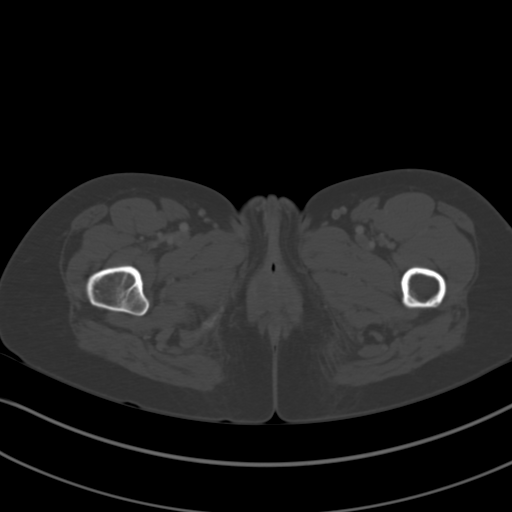
[im 11/88  soft-tissue]
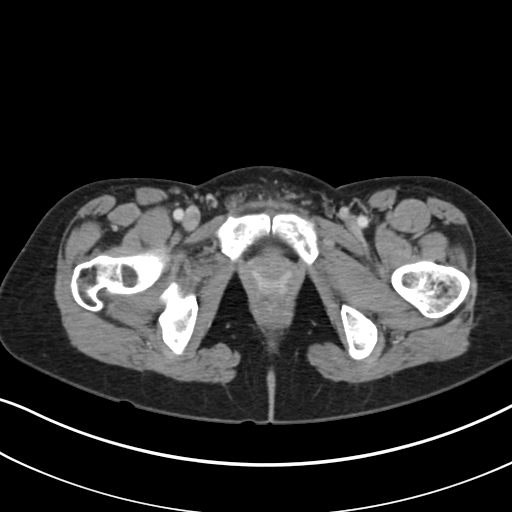
[im 19/88  soft-tissue]
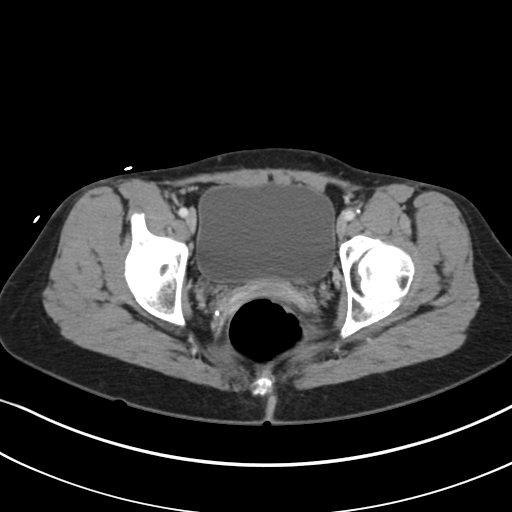
[im 26/88  soft-tissue]
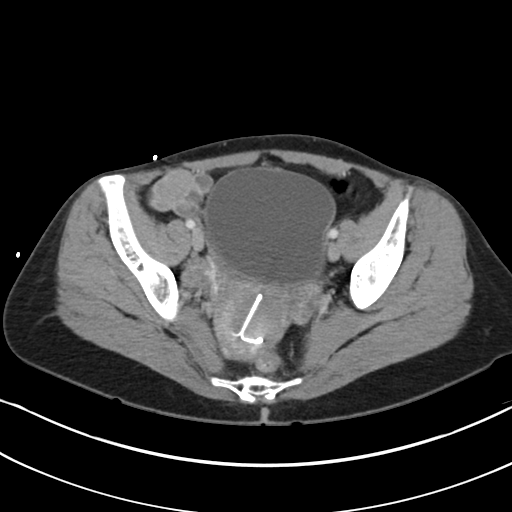
[im 30/88  soft-tissue]
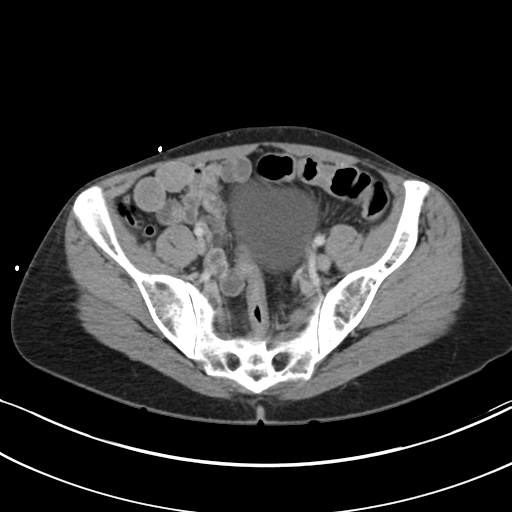
[im 37/88  soft-tissue]
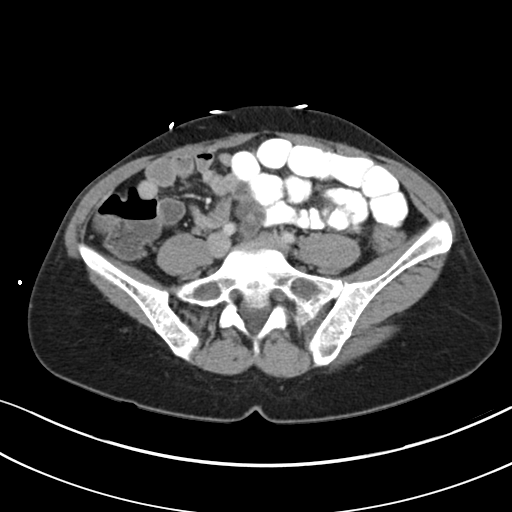
[im 44/88  soft-tissue]
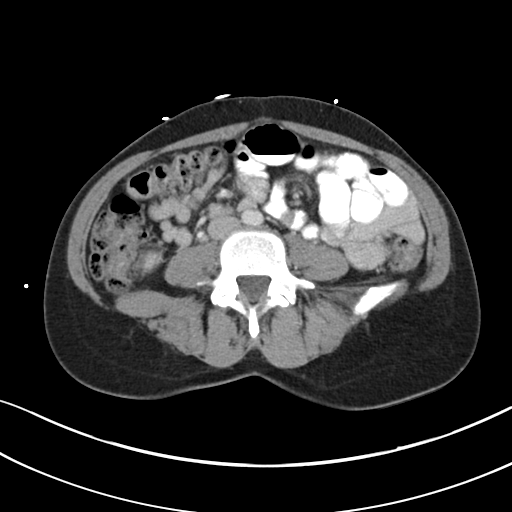
[im 51/88  soft-tissue]
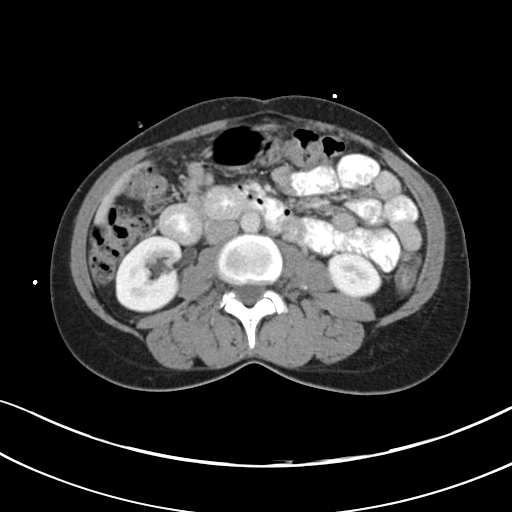
[im 59/88  soft-tissue]
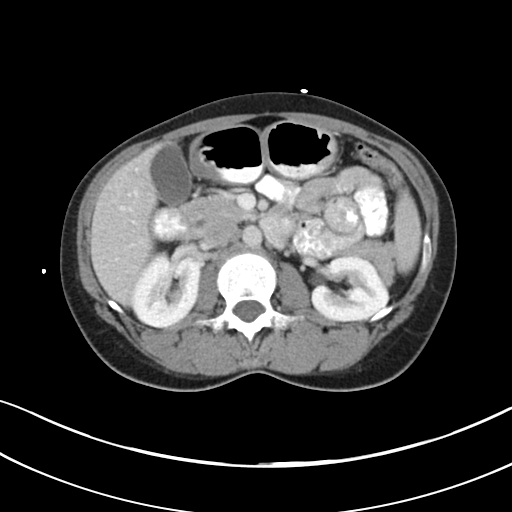
[im 59/88  bone]
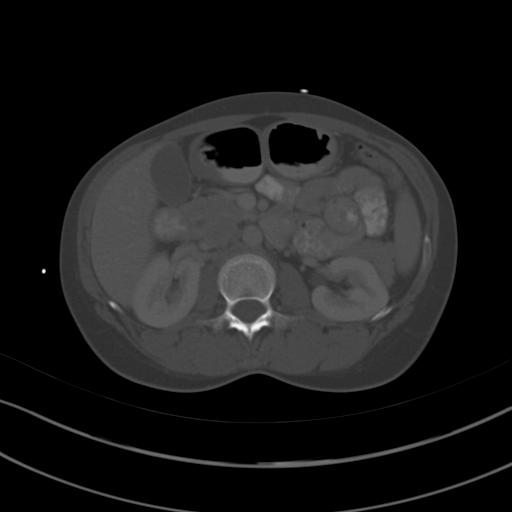
[im 62/88  soft-tissue]
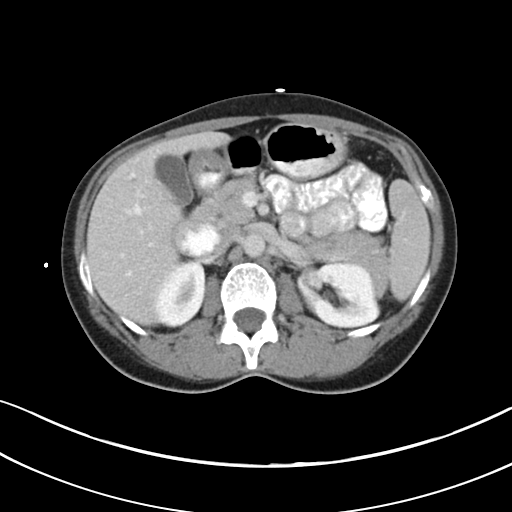
[im 69/88  soft-tissue]
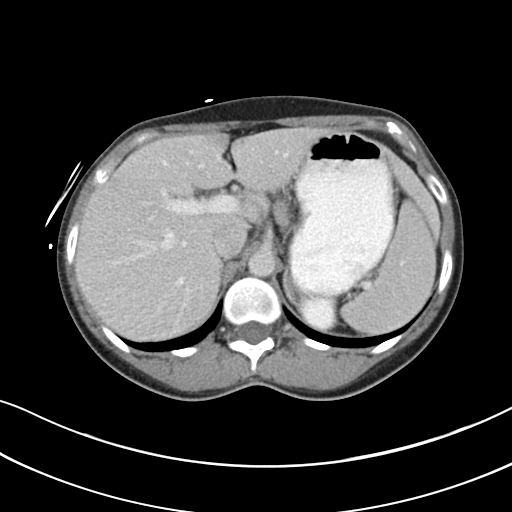
[im 77/88  soft-tissue]
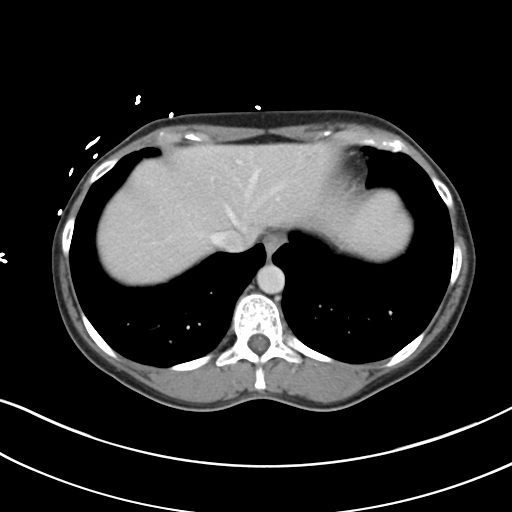
[im 84/88  soft-tissue]
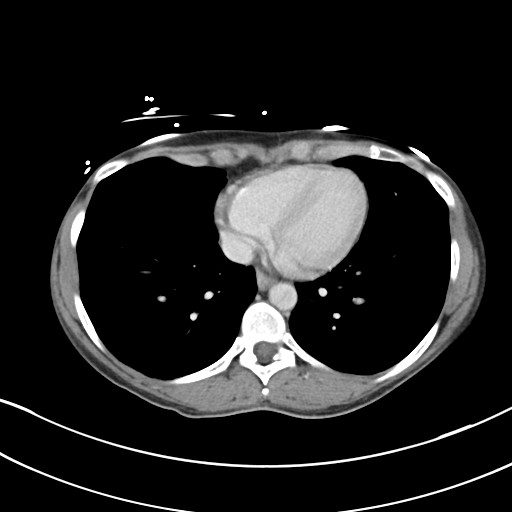

[Series 5: coronals · coronal · 0.69mm/px · 3 of 107 slices shown]
[im 36/107  soft-tissue]
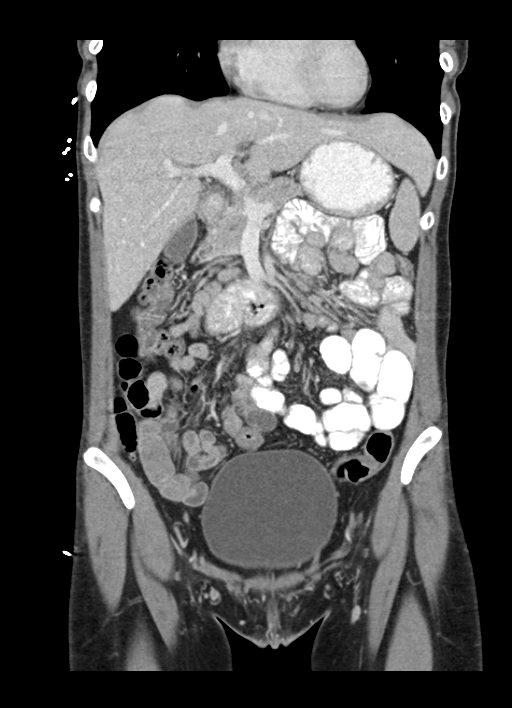
[im 48/107  soft-tissue]
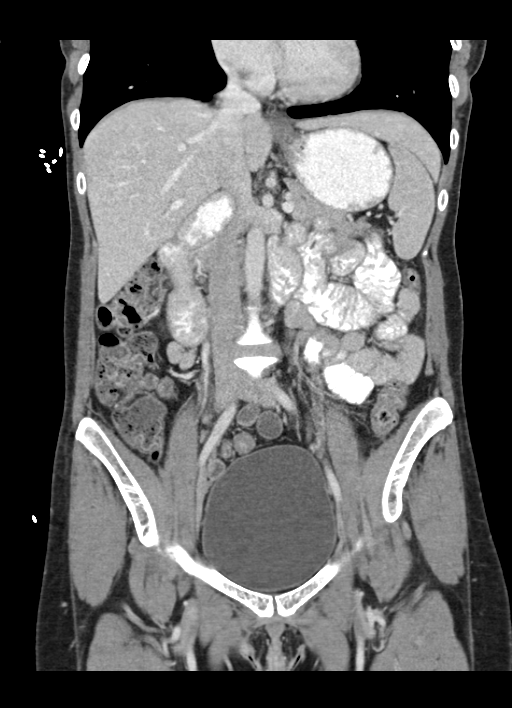
[im 59/107  soft-tissue]
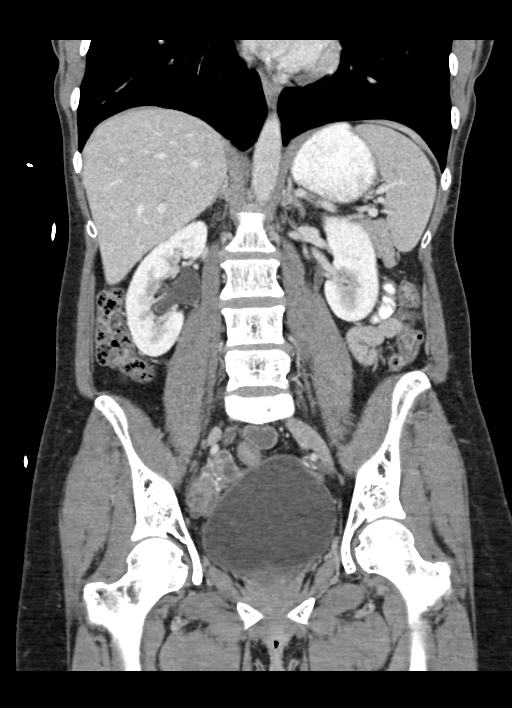

[16 of 46 positions shown; findings below may reference images not displayed]

FINDINGS: The visualized lung bases are clear.

The liver and spleen are unremarkable in appearance. The gallbladder
is within normal limits. The pancreas and adrenal glands are
unremarkable.

The kidneys are unremarkable in appearance. There is no evidence of
hydronephrosis. No renal or ureteral stones are seen. No perinephric
stranding is appreciated.

No free fluid is identified. The small bowel is unremarkable in
appearance. The stomach is within normal limits. No acute vascular
abnormalities are seen.

The appendix is normal in caliber and contains air, without evidence
for appendicitis. The colon is unremarkable in appearance.

The bladder is mildly distended and grossly unremarkable. The uterus
is unremarkable in appearance, with an intrauterine device noted in
expected position at the fundus of the uterus. No inguinal
lymphadenopathy is seen.

No acute osseous abnormalities are identified.
IMPRESSION: Unremarkable contrast-enhanced CT of the abdomen and pelvis.

## 2016-09-25 ENCOUNTER — Encounter: Payer: Self-pay | Admitting: Family Medicine

## 2017-01-12 ENCOUNTER — Other Ambulatory Visit: Payer: Self-pay | Admitting: Radiology

## 2017-01-15 ENCOUNTER — Encounter: Payer: Self-pay | Admitting: Family Medicine

## 2017-02-17 ENCOUNTER — Encounter (HOSPITAL_COMMUNITY): Payer: Self-pay | Admitting: Emergency Medicine

## 2017-02-17 DIAGNOSIS — K219 Gastro-esophageal reflux disease without esophagitis: Secondary | ICD-10-CM | POA: Insufficient documentation

## 2017-02-17 DIAGNOSIS — F17210 Nicotine dependence, cigarettes, uncomplicated: Secondary | ICD-10-CM | POA: Insufficient documentation

## 2017-02-17 DIAGNOSIS — R1010 Upper abdominal pain, unspecified: Secondary | ICD-10-CM | POA: Diagnosis present

## 2017-02-17 DIAGNOSIS — Z79899 Other long term (current) drug therapy: Secondary | ICD-10-CM | POA: Insufficient documentation

## 2017-02-17 NOTE — ED Triage Notes (Addendum)
Pt to ED c/o severe GERD - states she has hx of it, but it's never been like this. Pt states it began around lunchtime, she thought she needed to eat, but it became worse. Denies N/V. Reports some upper abdominal cramping and burning to epigastrium.

## 2017-02-18 ENCOUNTER — Emergency Department (HOSPITAL_COMMUNITY): Payer: BC Managed Care – PPO

## 2017-02-18 ENCOUNTER — Emergency Department (HOSPITAL_COMMUNITY)
Admission: EM | Admit: 2017-02-18 | Discharge: 2017-02-18 | Disposition: A | Discharge status: Home / Self Care | Payer: BC Managed Care – PPO | Attending: Emergency Medicine | Admitting: Emergency Medicine

## 2017-02-18 DIAGNOSIS — R1010 Upper abdominal pain, unspecified: Secondary | ICD-10-CM

## 2017-02-18 DIAGNOSIS — K219 Gastro-esophageal reflux disease without esophagitis: Secondary | ICD-10-CM

## 2017-02-18 LAB — CBC WITH DIFFERENTIAL/PLATELET
Basophils Absolute: 0 10*3/uL (ref 0.0–0.1)
Basophils Relative: 0 %
EOS ABS: 0.1 10*3/uL (ref 0.0–0.7)
EOS PCT: 1 %
HCT: 41.3 % (ref 36.0–46.0)
Hemoglobin: 13.8 g/dL (ref 12.0–15.0)
Lymphocytes Relative: 32 %
Lymphs Abs: 3.5 10*3/uL (ref 0.7–4.0)
MCH: 31.2 pg (ref 26.0–34.0)
MCHC: 33.4 g/dL (ref 30.0–36.0)
MCV: 93.4 fL (ref 78.0–100.0)
Monocytes Absolute: 1 10*3/uL (ref 0.1–1.0)
Monocytes Relative: 9 %
Neutro Abs: 6.4 10*3/uL (ref 1.7–7.7)
Neutrophils Relative %: 58 %
Platelets: 320 10*3/uL (ref 150–400)
RBC: 4.42 MIL/uL (ref 3.87–5.11)
RDW: 12.1 % (ref 11.5–15.5)
WBC: 11.1 10*3/uL — ABNORMAL HIGH (ref 4.0–10.5)

## 2017-02-18 LAB — COMPREHENSIVE METABOLIC PANEL
ALBUMIN: 3.2 g/dL — AB (ref 3.5–5.0)
ALT: 26 U/L (ref 14–54)
AST: 25 U/L (ref 15–41)
Alkaline Phosphatase: 48 U/L (ref 38–126)
Anion gap: 9 (ref 5–15)
BUN: 10 mg/dL (ref 6–20)
CHLORIDE: 107 mmol/L (ref 101–111)
CO2: 26 mmol/L (ref 22–32)
Calcium: 9 mg/dL (ref 8.9–10.3)
Creatinine, Ser: 0.8 mg/dL (ref 0.44–1.00)
GFR calc non Af Amer: >60 mL/min (ref >60)
GLUCOSE: 80 mg/dL (ref 65–99)
Potassium: 3.4 mmol/L — ABNORMAL LOW (ref 3.5–5.1)
SODIUM: 142 mmol/L (ref 135–145)
Total Bilirubin: 0.6 mg/dL (ref 0.3–1.2)
Total Protein: 5.3 g/dL — ABNORMAL LOW (ref 6.5–8.1)

## 2017-02-18 LAB — LIPASE, BLOOD: Lipase: 28 U/L (ref 11–51)

## 2017-02-18 MED ORDER — ONDANSETRON 4 MG PO TBDP: 4.0000 mg | ORAL_TABLET | Freq: Three times a day (TID) | ORAL | 0 refills | Status: AC | PRN

## 2017-02-18 MED ORDER — GI COCKTAIL ~~LOC~~
30.0000 mL | Freq: Once | ORAL | Status: AC
  Administered 2017-02-18: 30 mL via ORAL
  Filled 2017-02-18: qty 30

## 2017-02-18 MED ORDER — SUCRALFATE 1 GM/10ML PO SUSP: 1.0000 g | Freq: Three times a day (TID) | ORAL | 0 refills | Status: AC

## 2017-02-18 MED ORDER — PANTOPRAZOLE SODIUM 40 MG PO TBEC: 40.0000 mg | DELAYED_RELEASE_TABLET | Freq: Every day | ORAL | 1 refills | Status: AC

## 2017-02-18 MED ORDER — PANTOPRAZOLE SODIUM 40 MG PO TBEC
40.0000 mg | DELAYED_RELEASE_TABLET | Freq: Once | ORAL | Status: AC
  Administered 2017-02-18: 40 mg via ORAL
  Filled 2017-02-18: qty 1

## 2017-02-18 NOTE — Discharge Instructions (Signed)
Please avoid NSAIDs such as aspirin (Goody powders), ibuprofen (Motrin, Advil), naproxen (Aleve) as these may worsen your symptoms.  Tylenol 1000 mg every 6 hours is safe to take as long as you have no history of liver problems (heavy alcohol use, cirrhosis, hepatitis).  Please avoid spicy, acidic (citrus fruits, tomato based sauces, salsa), greasy, fatty foods.  Please avoid caffeine and alcohol.   ° °

## 2017-02-18 NOTE — ED Provider Notes (Signed)
TIME SEEN: 3:54 AM  CHIEF COMPLAINT: Upper abdominal pain  HPI: Patient is a 42 year old female with history of tobacco use who presents to the emergency department with burning upper abdominal pain that goes into her chest.  No shortness of breath.  States she does get a bitter, sour taste in her mouth.  Symptoms worse after eating.  Tried Tums, Pepcid, Rolaids and Mylanta at home without any relief.  Denies nausea, vomiting or diarrhea.  No bloody stool or melena.  Has had previous C-section before.  States she is cramping in her upper abdomen as well.  Still has her gallbladder.  No history of CAD, hypertension, diabetes or hyperlipidemia.  ROS: See HPI Constitutional: no fever  Eyes: no drainage  ENT: no runny nose   Cardiovascular:  no chest pain  Resp: no SOB  GI: no vomiting GU: no dysuria Integumentary: no rash  Allergy: no hives  Musculoskeletal: no leg swelling  Neurological: no slurred speech ROS otherwise negative  PAST MEDICAL HISTORY/PAST SURGICAL HISTORY:  Past Medical History:  Diagnosis Date  . ALLERGIC RHINITIS 09/13/2009  . Anxiety and depression 09/13/2009  . S/P right knee arthroscopy   . Tobacco use     MEDICATIONS:  Prior to Admission medications   Medication Sig Start Date End Date Taking? Authorizing Provider  escitalopram (LEXAPRO) 20 MG tablet TAKE 1 TABLET(20 MG) BY MOUTH DAILY 09/08/14   Kriste BasqueKim, Hannah R, DO  escitalopram (LEXAPRO) 20 MG tablet TAKE 1 TABLET BY MOUTH EVERY DAY 07/02/15   Terressa KoyanagiKim, Hannah R, DO  fluticasone (FLONASE) 50 MCG/ACT nasal spray Place into both nostrils daily.    [provider]  levonorgestrel (MIRENA) 20 MCG/24HR IUD 1 each by Intrauterine route once.    [provider]  LORazepam (ATIVAN) 0.5 MG tablet TAKE 1 TABLET BY MOUTH TWICE DAILY AS NEEDED FOR ANXIETY 10/28/13   Worthy RancherWebb, Padonda B, FNP  Lurasidone HCl (LATUDA) 20 MG TABS Take by mouth daily.    [provider]  montelukast (SINGULAIR) 10 MG tablet TAKE 1  TABLET(10 MG) BY MOUTH AT BEDTIME 07/21/14   Terressa KoyanagiKim, Hannah R, DO  Multiple Vitamin (MULTIVITAMIN WITH MINERALS) TABS tablet Take 1 tablet by mouth daily.    [provider]    ALLERGIES:  Allergies  Allergen Reactions  . Sulfa Antibiotics Rash    SOCIAL HISTORY:  Social History   Tobacco Use  . Smoking status: Current Every Day Smoker    Packs/day: 1.00    Years: 20.00    Pack years: 20.00    Types: Cigarettes  . Smokeless tobacco: Current User  . Tobacco comment: 1ppd since highschool  Substance Use Topics  . Alcohol use: No    FAMILY HISTORY: No family history on file.  EXAM: BP 121/77 (BP Location: Right Arm)   Pulse (!) 112   Temp 98 F (36.7 C) (Oral)   Resp 18   Ht 5\' 3"  (1.6 m)   Wt 45.4 kg (100 lb)   SpO2 100%   BMI 17.71 kg/m  CONSTITUTIONAL: Alert and oriented and responds appropriately to questions. Well-appearing; well-nourished HEAD: Normocephalic EYES: Conjunctivae clear, pupils appear equal, EOMI ENT: normal nose; moist mucous membranes NECK: Supple, no meningismus, no nuchal rigidity, no LAD  CARD: RRR; S1 and S2 appreciated; no murmurs, no clicks, no rubs, no gallops RESP: Normal chest excursion without splinting or tachypnea; breath sounds clear and equal bilaterally; no wheezes, no rhonchi, no rales, no hypoxia or respiratory distress, speaking full sentences ABD/GI: Normal  bowel sounds; non-distended; soft, tender throughout the epigastric region and right upper quadrant with negative Murphy sign, no rebound, no guarding, no peritoneal signs, no hepatosplenomegaly BACK:  The back appears normal and is non-tender to palpation, there is no CVA tenderness EXT: Normal ROM in all joints; non-tender to palpation; no edema; normal capillary refill; no cyanosis, no calf tenderness or swelling    SKIN: Normal color for age and race; warm; no rash NEURO: Moves all extremities equally PSYCH: The patient's mood and manner are appropriate. Grooming and  personal hygiene are appropriate.  MEDICAL DECISION MAKING: Patient here with symptoms that sound like acid reflux, gastritis, esophagitis.  She is tender in the right upper quadrant so I will obtain right upper quadrant ultrasound to rule out cholelithiasis or cholecystitis.  Pancreatitis also in the differential.  Doubt appendicitis.  Rest of her abdominal exam is benign.  Low suspicion for ACS, PE or dissection.  EKG shows no ischemic change.  Will treat with Protonix, GI cocktail.  ED PROGRESS: Patient's labs are unremarkable.  She has a mild leukocytosis which appears chronic.  Normal creatinine, LFTs and lipase.  Right upper quadrant ultrasound shows contracted gallbladder.  She did eat tuna just prior to the ultrasound despite being told to stay n.p.o.  No gallstones or biliary dilatation noted.  She states that she does not feel much better after GI cocktail but is resting comfortably and declines any further medication.  Will discharge her on Zofran, Protonix, Carafate.  Recommended diet changes.  Will give outpatient GI follow-up.  At this time, I do not feel there is any life-threatening condition present. I have reviewed and discussed all results (EKG, imaging, lab, urine as appropriate) and exam findings with patient/family. I have reviewed nursing notes and appropriate previous records.  I feel the patient is safe to be discharged home without further emergent workup and can continue workup as an outpatient as needed. Discussed usual and customary return precautions. Patient/family verbalize understanding and are comfortable with this plan.  Outpatient follow-up has been provided if needed. All questions have been answered.     EKG Interpretation  Date/Time:  Wednesday February 18 2017 04:17:34 EST Ventricular Rate:  90 PR Interval:  128 QRS Duration: 102 QT Interval:  374 QTC Calculation: 457 R Axis:   98 Text Interpretation:  Normal sinus rhythm Rightward axis Incomplete right  bundle branch block Borderline ECG No old tracing to compare Confirmed by Ward, Baxter Hire [phone]) on 02/18/2017 4:22:45 AM          Ward, Layla Maw, DO 02/18/17 0522

## 2020-01-24 ENCOUNTER — Other Ambulatory Visit: Payer: Self-pay

## 2020-01-24 ENCOUNTER — Encounter (HOSPITAL_COMMUNITY): Payer: Self-pay | Admitting: *Deleted

## 2020-01-24 ENCOUNTER — Emergency Department (HOSPITAL_COMMUNITY)
Admission: EM | Admit: 2020-01-24 | Discharge: 2020-01-25 | Discharge status: Against Medical Advice | Payer: Medicaid Other | Attending: Emergency Medicine | Admitting: Emergency Medicine

## 2020-01-24 DIAGNOSIS — T50991A Poisoning by other drugs, medicaments and biological substances, accidental (unintentional), initial encounter: Secondary | ICD-10-CM | POA: Diagnosis not present

## 2020-01-24 DIAGNOSIS — F17210 Nicotine dependence, cigarettes, uncomplicated: Secondary | ICD-10-CM | POA: Insufficient documentation

## 2020-01-24 DIAGNOSIS — T6591XA Toxic effect of unspecified substance, accidental (unintentional), initial encounter: Secondary | ICD-10-CM
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
## 2020-01-25 LAB — CBC WITH DIFFERENTIAL/PLATELET
Abs Immature Granulocytes: 0.01 10*3/uL (ref 0.00–0.07)
Basophils Absolute: 0.1 10*3/uL (ref 0.0–0.1)
Basophils Relative: 1 %
Eosinophils Absolute: 0.2 10*3/uL (ref 0.0–0.5)
Eosinophils Relative: 2 %
HCT: 45.7 % (ref 36.0–46.0)
Hemoglobin: 14.8 g/dL (ref 12.0–15.0)
Immature Granulocytes: 0 %
Lymphocytes Relative: 21 %
Lymphs Abs: 2 10*3/uL (ref 0.7–4.0)
MCH: 30.3 pg (ref 26.0–34.0)
MCHC: 32.4 g/dL (ref 30.0–36.0)
MCV: 93.6 fL (ref 80.0–100.0)
Monocytes Absolute: 0.9 10*3/uL (ref 0.1–1.0)
Monocytes Relative: 10 %
Neutro Abs: 6.2 10*3/uL (ref 1.7–7.7)
Neutrophils Relative %: 66 %
Platelets: 372 10*3/uL (ref 150–400)
RBC: 4.88 MIL/uL (ref 3.87–5.11)
RDW: 11.9 % (ref 11.5–15.5)
WBC: 9.4 10*3/uL (ref 4.0–10.5)
nRBC: 0 % (ref 0.0–0.2)

## 2020-01-25 LAB — COMPREHENSIVE METABOLIC PANEL
ALT: 15 U/L (ref 0–44)
AST: 19 U/L (ref 15–41)
Albumin: 3.6 g/dL (ref 3.5–5.0)
Alkaline Phosphatase: 43 U/L (ref 38–126)
Anion gap: 10 (ref 5–15)
BUN: 16 mg/dL (ref 6–20)
CO2: 25 mmol/L (ref 22–32)
Calcium: 8.9 mg/dL (ref 8.9–10.3)
Chloride: 106 mmol/L (ref 98–111)
Creatinine, Ser: 0.82 mg/dL (ref 0.44–1.00)
GFR, Estimated: >60 mL/min (ref >60)
Glucose, Bld: 106 mg/dL — ABNORMAL HIGH (ref 70–99)
Potassium: 3.4 mmol/L — ABNORMAL LOW (ref 3.5–5.1)
Sodium: 141 mmol/L (ref 135–145)
Total Bilirubin: 0.6 mg/dL (ref 0.3–1.2)
Total Protein: 6.1 g/dL — ABNORMAL LOW (ref 6.5–8.1)

## 2020-01-25 LAB — ACETAMINOPHEN LEVEL: Acetaminophen (Tylenol), Serum: <10 ug/mL — ABNORMAL LOW (ref 10–30)

## 2020-01-25 LAB — SALICYLATE LEVEL: Salicylate Lvl: <7 mg/dL — ABNORMAL LOW (ref 7.0–30.0)

## 2020-01-25 LAB — ETHANOL: Alcohol, Ethyl (B): <10 mg/dL (ref <10)

## 2020-01-25 NOTE — ED Notes (Addendum)
Pt refusing to leave room, now standing at nurses desk speaking to MD. Pt is requesting drug testing and STD tesing. Attempted to explain to pt that the MD had ordered such tests but pt was unable to provide a urine sample and that information for out pt testing was given in discharge papers. Pt now requesting SANE nurse. Discharge undone and pt now in room 3.

## 2020-01-25 NOTE — ED Notes (Signed)
Pt ambulated to the bathroom on her own power. Pt unable to provide a urine sample at this time. States she will try again later.
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
## 2020-01-25 NOTE — ED Notes (Signed)
Went into room to tell pt that sane will be coming down to see her but pt was gone , informed sane nurse that pt had walked out

## 2020-01-25 NOTE — Discharge Instructions (Signed)
Follow up STD testing results with primary doctor Friday or Monday.  Return for new concerns.

## 2022-07-21 ENCOUNTER — Emergency Department (HOSPITAL_COMMUNITY)
Admission: EM | Admit: 2022-07-21 | Discharge: 2022-07-22 | Discharge status: Against Medical Advice | Payer: Managed Care, Other (non HMO) | Attending: Emergency Medicine | Admitting: Emergency Medicine

## 2022-07-21 ENCOUNTER — Other Ambulatory Visit: Payer: Self-pay

## 2022-07-21 ENCOUNTER — Encounter (HOSPITAL_COMMUNITY): Payer: Self-pay

## 2022-07-21 ENCOUNTER — Emergency Department (HOSPITAL_COMMUNITY): Payer: Managed Care, Other (non HMO)

## 2022-07-21 DIAGNOSIS — Z5321 Procedure and treatment not carried out due to patient leaving prior to being seen by health care provider: Secondary | ICD-10-CM | POA: Diagnosis not present

## 2022-07-21 DIAGNOSIS — R42 Dizziness and giddiness: Secondary | ICD-10-CM | POA: Insufficient documentation

## 2022-07-21 DIAGNOSIS — R55 Syncope and collapse: Secondary | ICD-10-CM | POA: Diagnosis present

## 2022-07-21 LAB — CBC WITH DIFFERENTIAL/PLATELET
Abs Immature Granulocytes: 0.06 10*3/uL (ref 0.00–0.07)
Basophils Absolute: 0.1 10*3/uL (ref 0.0–0.1)
Basophils Relative: 1 %
Eosinophils Absolute: 0.1 10*3/uL (ref 0.0–0.5)
Eosinophils Relative: 1 %
HCT: 43.4 % (ref 36.0–46.0)
Hemoglobin: 14.2 g/dL (ref 12.0–15.0)
Immature Granulocytes: 1 %
Lymphocytes Relative: 14 %
Lymphs Abs: 1.5 10*3/uL (ref 0.7–4.0)
MCH: 30.1 pg (ref 26.0–34.0)
MCHC: 32.7 g/dL (ref 30.0–36.0)
MCV: 91.9 fL (ref 80.0–100.0)
Monocytes Absolute: 0.7 10*3/uL (ref 0.1–1.0)
Monocytes Relative: 6 %
Neutro Abs: 8.9 10*3/uL — ABNORMAL HIGH (ref 1.7–7.7)
Neutrophils Relative %: 77 %
Platelets: 329 10*3/uL (ref 150–400)
RBC: 4.72 MIL/uL (ref 3.87–5.11)
RDW: 11.7 % (ref 11.5–15.5)
WBC: 11.4 10*3/uL — ABNORMAL HIGH (ref 4.0–10.5)
nRBC: 0 % (ref 0.0–0.2)

## 2022-07-21 LAB — BASIC METABOLIC PANEL
Anion gap: 6 (ref 5–15)
BUN: 10 mg/dL (ref 6–20)
CO2: 25 mmol/L (ref 22–32)
Calcium: 8.4 mg/dL — ABNORMAL LOW (ref 8.9–10.3)
Chloride: 107 mmol/L (ref 98–111)
Creatinine, Ser: 0.8 mg/dL (ref 0.44–1.00)
GFR, Estimated: >60 mL/min (ref >60)
Glucose, Bld: 104 mg/dL — ABNORMAL HIGH (ref 70–99)
Potassium: 4.1 mmol/L (ref 3.5–5.1)
Sodium: 138 mmol/L (ref 135–145)

## 2022-07-21 LAB — CBG MONITORING, ED: Glucose-Capillary: 93 mg/dL (ref 70–99)

## 2022-07-21 LAB — PREGNANCY, URINE: Preg Test, Ur: NEGATIVE

## 2022-07-21 MED ORDER — MECLIZINE HCL 25 MG PO TABS
50.0000 mg | ORAL_TABLET | Freq: Once | ORAL | Status: AC
  Administered 2022-07-21: 50 mg via ORAL
  Filled 2022-07-21: qty 2

## 2022-07-21 NOTE — ED Triage Notes (Signed)
Pt was at dr office and got blood drawn and right after had a syncopal episode. Pt is A&Ox4.

## 2022-07-21 NOTE — ED Provider Triage Note (Signed)
Emergency Medicine Provider Triage Evaluation Note  Gabriella [name] , a 47 y.o. female  was evaluated in triage.  Pt complains of loss of consciousness.  Patient reports she has been suffering from vertigo for the past several days and was at her doctor's office today, had blood drawn, thereafter became lightheaded and think she briefly lost consciousness.  She has had syncope in the past.  She also suffers and vertigo.  She denies history of arrhythmia.  She currently feels much better, says while she is sitting she does not have active vertigo..  Review of Systems  Positive: Loss of consciousness, vertigo Negative: Chest pain, difficulty breathing, numbness or weakness  Physical Exam  BP 95/68 (BP Location: Right Arm)   Pulse 70   Temp 97.8 F (36.6 C) (Oral)   Resp 16   Ht 5\' 3"  (1.6 m)   Wt 45.4 kg   SpO2 100%   BMI 17.73 kg/m  Gen:   Awake, no distress   Resp:  Normal effort  MSK:   Moves extremities without difficulty  Other:  No evident neurological deficits  Medical Decision Making  Medically screening exam initiated at 7:32 PM.  Appropriate orders placed.  KIMELA MALSTROM was informed that the remainder of the evaluation will be completed by another provider, this initial triage assessment does not replace that evaluation, and the importance of remaining in the ED until their evaluation is complete.  Syncope versus near syncope evaluation Vertigo   Terald Sleeper, MD 07/21/22 [phone]

## 2022-12-18 ENCOUNTER — Ambulatory Visit: Payer: Medicaid Other

## 2022-12-18 ENCOUNTER — Ambulatory Visit (INDEPENDENT_AMBULATORY_CARE_PROVIDER_SITE_OTHER): Payer: Medicaid Other | Admitting: Podiatry

## 2022-12-18 DIAGNOSIS — M1A0711 Idiopathic chronic gout, right ankle and foot, with tophus (tophi): Secondary | ICD-10-CM | POA: Diagnosis not present

## 2022-12-18 DIAGNOSIS — M10471 Other secondary gout, right ankle and foot: Secondary | ICD-10-CM | POA: Diagnosis not present

## 2022-12-18 DIAGNOSIS — M79672 Pain in left foot: Secondary | ICD-10-CM | POA: Diagnosis not present

## 2022-12-18 DIAGNOSIS — M79671 Pain in right foot: Secondary | ICD-10-CM

## 2022-12-18 MED ORDER — MELOXICAM 15 MG PO TABS: 15.0000 mg | ORAL_TABLET | Freq: Every day | ORAL | 3 refills | Status: AC

## 2022-12-18 NOTE — Patient Instructions (Signed)

## 2022-12-19 NOTE — Progress Notes (Signed)
Subjective:  Patient ID: Gabriella [name], female    DOB: [birth year],  MRN: [id number]  No chief complaint on file.   Discussed the use of AI scribe software for clinical note transcription with the patient, who gave verbal consent to proceed.  History of Present Illness   The 47 year old patient presents with bilateral foot pain, more pronounced in the right foot. The patient attributes the discomfort to wearing steel toe boots, which she no longer uses. She describes a knot about the size of a marble on the right foot, causing pain, itching, and occasional burning. The patient also reports heel pain on the left foot after a long day.  The patient is a smoker, consuming about a pack a day. She has no known medical issues and denies having diabetes. The patient's right foot pain seems to be associated with a bunion and possible inflammation under the joint. She reports that the knot on the right foot has decreased in size since she first noticed it. The patient also mentions that the severity of the pain varies from day to day.  On the left foot, the patient reports no pain under the heel. However, she mentions a previous issue with a large knot on the heel, which has since improved. The patient has a high arch, which may be contributing to pressure on the bone.  The patient has not been diagnosed with gout, nor is there a known family history of the condition. However, she has noticed a small circle on her toe, which could be indicative of developing gout. The patient has not noticed any redness, swelling, or heat in the area.          Objective:    Physical Exam   MUSCULOSKELETAL: Palpable knot on right foot, size of a marble, with pain, itching, and occasional burning sensation. Tenderness under the big toe joint on right foot and under the heel on left foot. Mild tenderness under first MTP joint on right foot with dorsal medial bunion. Flexible pes cavus foot type with slight tenderness on  plantar heel of left foot. No pain at the insertion of the plantar fascia or in the midsubstance of the plantar fascia itself. Gastrocnemius equinus present.       No images are attached to the encounter.    Results   RADIOLOGY Foot radiograph: Increased calcaneal pitch on the left. No stress fracture, acute fracture, or heel spur noted. Right foot shows no acute abnormalities but increased soft tissue density around the medial first MTP joint and periarticular erosion in the metatarsal head suggestive of crystalline arthropathy.      Assessment:   1. Pain of left heel   2. Acute gout due to other secondary cause involving toe of right foot      Plan:  Patient was evaluated and treated and all questions answered.  Assessment and Plan    Foot Pain   She experiences pain under the big toe joint of the right foot and under the heel of the left foot, with a possible bunion causing discomfort in the right foot. High arches are also noted, but there are no signs of plantar fasciitis. We will start meloxicam, a prescription-strength anti-inflammatory, once daily for a month and then as needed. Tuli's Heel Cup is recommended for cushioning and support under the left heel. Stretching exercises are provided to strengthen foot muscles and stretch the calf muscle.  Possible Gout   She might be developing gout in the right big toe  joint, although there is no history of gout or family history of gout, and no current flare-up is present. Lab work to check uric acid levels during a flare-up is ordered. Information on gout and its triggers is provided. If pain persists or worsens in 5-6 weeks, an MRI will be ordered to evaluate the joint in further detail.  Smoking   She smokes a pack a day. No specific plan was discussed.  Follow-up   She should check with her primary care doctor about previous lab work and uric acid levels. A return visit in 5-6 weeks is advised if pain persists or worsens.           No follow-ups on file.
